# Patient Record
Sex: Female | Born: 1960 | Race: White | Hispanic: No | Marital: Single | Smoking: Current every day smoker
Health system: Southern US, Community
[De-identification: ages and names within clinical notes are randomized; demographics above are authoritative.]

## PROBLEM LIST (undated history)

## (undated) DIAGNOSIS — N289 Disorder of kidney and ureter, unspecified: Secondary | ICD-10-CM

## (undated) DIAGNOSIS — J189 Pneumonia, unspecified organism: Secondary | ICD-10-CM

## (undated) DIAGNOSIS — F329 Major depressive disorder, single episode, unspecified: Secondary | ICD-10-CM

## (undated) DIAGNOSIS — F32A Depression, unspecified: Secondary | ICD-10-CM

## (undated) DIAGNOSIS — S62109A Fracture of unspecified carpal bone, unspecified wrist, initial encounter for closed fracture: Secondary | ICD-10-CM

## (undated) DIAGNOSIS — F410 Panic disorder [episodic paroxysmal anxiety] without agoraphobia: Secondary | ICD-10-CM

## (undated) DIAGNOSIS — F319 Bipolar disorder, unspecified: Secondary | ICD-10-CM

## (undated) DIAGNOSIS — F419 Anxiety disorder, unspecified: Secondary | ICD-10-CM

## (undated) HISTORY — PX: ABDOMINAL HYSTERECTOMY: SHX81

---

## 2007-08-18 ENCOUNTER — Emergency Department: Payer: Self-pay | Admitting: Ophthalmology

## 2007-11-12 ENCOUNTER — Inpatient Hospital Stay (HOSPITAL_COMMUNITY): Admission: RE | Admit: 2007-11-12 | Discharge: 2007-11-16 | Payer: Self-pay | Admitting: *Deleted

## 2007-11-12 ENCOUNTER — Emergency Department (HOSPITAL_COMMUNITY): Admission: EM | Admit: 2007-11-12 | Discharge: 2007-11-12 | Payer: Self-pay | Admitting: Emergency Medicine

## 2007-11-12 ENCOUNTER — Ambulatory Visit: Payer: Self-pay | Admitting: *Deleted

## 2010-07-10 ENCOUNTER — Ambulatory Visit: Payer: Self-pay
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
## 2011-05-09 NOTE — Discharge Summary (Signed)
NAME:  Mary Greene, SKOP NO.:  [id number]   MEDICAL RECORD NO.:  [id number]          PATIENT TYPE:  IPS   LOCATION:  0501                          FACILITY:  BH   PHYSICIAN:  Geoffery Lyons, M.D.      DATE OF BIRTH:  28-Apr-1961   DATE OF ADMISSION:  11/12/2007  DATE OF DISCHARGE:  11/16/2007                               DISCHARGE SUMMARY   CHIEF COMPLAINT AND PRESENT ILLNESS:  This was the first admission to  [facility] for this 50 year old divorced white female,  who presented to [facility].  She endorsed she had  received an eviction note.  She felt hopeless and suicidal.  She has a  history of prior psychiatric hospitalization, but upon this evaluation  was not on any medications.  She reports poor sleep, panic attacks,  excessive worry, abusing alcohol, as she claimed to __________  symptoms  of anxiety.  The day before the admission she reported a plan to wreck  her car.  Endorsed that she was tired of struggling.  Alcohol level in  the ED was 176, endorsed that she had drank on and off most of her life  and wanted to wean herself off alcohol totally, once she received the  eviction note she drank more.   PAST PSYCHIATRIC HISTORY:  In 2004 [facility] for depression.  Has  had Zyprexa in the past, which caused weight gain.  Was seeing a  psychiatrist in [place] who __________  atypical antipsychotics and  mood stabilizers, with antidepressants.   ALCOHOL/DRUG HISTORY:  as already stated, persistent use of alcohol 6 to  12 beers 4 to 6 times a week.   MEDICAL HISTORY:  Had a stent placed in her right kidney.   MEDICATIONS:  None prescribed.   PHYSICAL EXAMINATION:  Physical exam performed and failed to show any  acute findings.   LABORATORY WORK:  White blood cells 12.9, hemoglobin 15.6.   MENTAL STATUS EXAM:  Reveals an alert cooperative female, casually  groomed and dressed.  Speech was not pressured, was  normal in rate,  tempo and production.  Mood was depressed.  Affect depressed.  Thought  process was normal.  Thought processes were logical, coherent and  relevant.  Speech was normal in rate, tempo and production.  There was  no evidence of active suicidal or homicidal ideas, and no  hallucinations.  Cognition was well-preserved.   DIAGNOSES:  AXIS I:  1.  Depressive disorder, not otherwise specified.  2.  Alcohol abuse, rule out dependence.  AXIS II:  No diagnosis.  AXIS III:  Nonfunctioning right kidney.  AXIS IV:  Moderate.  AXIS V:  On admission 30, highest GAF last year 60.   COURSE IN THE HOSPITAL:  She was admitted and started in individual and  group psychotherapy.  She was given Ambien for sleep.  She was detoxed  with Librium.  As already stated the patient is a 50 year old female  divorced, 1 daughter.  Divorced.  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  There  waslongstanding history of problem with alcohol.  Abstained for a  while.  Claimed that she caused the depression by drinking.  Felt that  the daughter just came by when she needed something from her.  Endorsed  with mood fluctuation with episodes of increased energy, racing  thoughts, decreased sleep, with episodes of depression, with no energy  and no motivation.  So, a counselor before has been on medication.   MEDICATIONS:  Has had a hard time of 40 medications.  Claims trials  with:  1. Klonopin.  2. Ambien.  3. Seroquel.  4. Wellbutrin.  5. Zoloft.  6. Zyprexa, works well, but concerned about blood sugar as well as      weight gain.   When depressed has been up to 6 months without interacting with people.  Family history of bipolar.  By November 14, 2007, she endorsed that she  was feeling better.  We had started Abilify 10 mg per day.  She  continued to be involved in  individual and group process.  Her mood was  euthymic.  Her affect was brighter.  She said that she was committed to  abstinence.   DISCHARGE DIAGNOSES:  AXIS I:  Bipolar disorder depressed.  Alcohol  dependence.  AXIS II:  No diagnosis.  AXIS III:  Nonfunctioning right kidney.  AXIS IV:  Moderate.  AXIS V:  On discharge 55 to 60.   DISCHARGE MEDICATIONS:  Discharged on:  1. Abilify 10 mg per day.  2. Trazodone 50 mg 1 to 2 tablets at bedtime for sleep.   DISCHARGE FOLLOWUP:  Follow up with __________ .      Geoffery Lyons, M.D.  Electronically Signed     IL/MEDQ  D:  12/19/2007  T:  12/20/2007  Job:  [id number]

## 2011-09-30 LAB — BASIC METABOLIC PANEL
BUN: 6
CO2: 23
Chloride: 112
Creatinine, Ser: 0.93
Glucose, Bld: 100 — ABNORMAL HIGH

## 2011-09-30 LAB — RAPID URINE DRUG SCREEN, HOSP PERFORMED
Barbiturates: NOT DETECTED
Benzodiazepines: NOT DETECTED
Cocaine: NOT DETECTED

## 2011-09-30 LAB — CBC
MCHC: 35.4
MCV: 97
Platelets: 276

## 2011-09-30 LAB — TSH: TSH: 1.235

## 2011-09-30 LAB — ETHANOL: Alcohol, Ethyl (B): 176 — ABNORMAL HIGH

## 2012-04-24 ENCOUNTER — Emergency Department: Payer: Self-pay | Admitting: Internal Medicine

## 2012-06-29 ENCOUNTER — Emergency Department: Payer: Self-pay | Admitting: Unknown Physician Specialty

## 2012-06-29 LAB — CBC
HCT: 45.2 % (ref 35.0–47.0)
MCH: 31.9 pg (ref 26.0–34.0)
MCHC: 33.1 g/dL (ref 32.0–36.0)
RDW: 13.3 % (ref 11.5–14.5)

## 2012-06-29 LAB — BASIC METABOLIC PANEL
Anion Gap: 9 (ref 7–16)
Calcium, Total: 9.3 mg/dL (ref 8.5–10.1)
Co2: 23 mmol/L (ref 21–32)
EGFR (African American): >60
EGFR (Non-African Amer.): >60
Glucose: 93 mg/dL (ref 65–99)
Osmolality: 285 (ref 275–301)

## 2012-06-29 LAB — TROPONIN I: Troponin-I: <0.02 ng/mL

## 2012-09-25 ENCOUNTER — Emergency Department: Payer: Self-pay | Admitting: Emergency Medicine

## 2012-09-25 LAB — CBC
Platelet: 259 10*3/uL (ref 150–440)
RDW: 14.3 % (ref 11.5–14.5)
WBC: 13.1 10*3/uL — ABNORMAL HIGH (ref 3.6–11.0)

## 2012-09-25 LAB — URINALYSIS, COMPLETE
Bilirubin,UR: NEGATIVE
Ketone: NEGATIVE
Leukocyte Esterase: NEGATIVE
Ph: 7 (ref 4.5–8.0)
Protein: NEGATIVE
RBC,UR: 3 /HPF (ref 0–5)

## 2013-03-03 ENCOUNTER — Emergency Department: Payer: Self-pay | Admitting: Unknown Physician Specialty

## 2013-04-20 ENCOUNTER — Emergency Department: Payer: Self-pay | Admitting: Emergency Medicine

## 2013-04-21 ENCOUNTER — Emergency Department: Payer: Self-pay | Admitting: Emergency Medicine

## 2013-04-21 LAB — URINALYSIS, COMPLETE
Bacteria: NONE SEEN
Blood: NEGATIVE
Glucose,UR: NEGATIVE mg/dL (ref 0–75)
Hyaline Cast: 1
Ketone: NEGATIVE
Nitrite: NEGATIVE
Ph: 6 (ref 4.5–8.0)
Protein: NEGATIVE
RBC,UR: 1 /HPF (ref 0–5)
Specific Gravity: 1.011 (ref 1.003–1.030)
Squamous Epithelial: 1
WBC UR: 1 /HPF (ref 0–5)

## 2013-04-21 LAB — CBC
HCT: 41.3 % (ref 35.0–47.0)
MCHC: 34.6 g/dL (ref 32.0–36.0)
MCV: 95 fL (ref 80–100)
RBC: 4.35 10*6/uL (ref 3.80–5.20)
WBC: 10.8 10*3/uL (ref 3.6–11.0)

## 2013-04-21 LAB — COMPREHENSIVE METABOLIC PANEL
Albumin: 3.5 g/dL (ref 3.4–5.0)
Alkaline Phosphatase: 78 U/L (ref 50–136)
Anion Gap: 3 — ABNORMAL LOW (ref 7–16)
BUN: 7 mg/dL (ref 7–18)
Bilirubin,Total: 0.2 mg/dL (ref 0.2–1.0)
Calcium, Total: 8.9 mg/dL (ref 8.5–10.1)
Co2: 25 mmol/L (ref 21–32)
Glucose: 89 mg/dL (ref 65–99)
Potassium: 3.8 mmol/L (ref 3.5–5.1)
SGPT (ALT): 23 U/L (ref 12–78)

## 2013-04-21 LAB — LIPASE, BLOOD: Lipase: 142 U/L (ref 73–393)

## 2013-06-26 ENCOUNTER — Emergency Department: Payer: Self-pay | Admitting: Emergency Medicine

## 2013-06-26 LAB — COMPREHENSIVE METABOLIC PANEL
Albumin: 3.4 g/dL (ref 3.4–5.0)
Alkaline Phosphatase: 94 U/L (ref 50–136)
Bilirubin,Total: 0.5 mg/dL (ref 0.2–1.0)
Calcium, Total: 9 mg/dL (ref 8.5–10.1)
Creatinine: 0.94 mg/dL (ref 0.60–1.30)
EGFR (Non-African Amer.): >60
Potassium: 3.7 mmol/L (ref 3.5–5.1)
SGPT (ALT): 22 U/L (ref 12–78)
Sodium: 141 mmol/L (ref 136–145)

## 2013-06-26 LAB — LIPASE, BLOOD: Lipase: 124 U/L (ref 73–393)

## 2013-06-26 LAB — CBC
MCH: 32.5 pg (ref 26.0–34.0)
MCHC: 34.2 g/dL (ref 32.0–36.0)
Platelet: 208 10*3/uL (ref 150–440)
RBC: 4.55 10*6/uL (ref 3.80–5.20)
WBC: 9 10*3/uL (ref 3.6–11.0)

## 2014-03-13 ENCOUNTER — Ambulatory Visit: Payer: Self-pay

## 2014-06-10 ENCOUNTER — Emergency Department: Payer: Self-pay | Admitting: Emergency Medicine

## 2014-06-10 LAB — URINALYSIS, COMPLETE
Bilirubin,UR: NEGATIVE
Glucose,UR: NEGATIVE mg/dL (ref 0–75)
KETONE: NEGATIVE
Leukocyte Esterase: NEGATIVE
Nitrite: NEGATIVE
PH: 5 (ref 4.5–8.0)
Protein: NEGATIVE
RBC,UR: 1 /HPF (ref 0–5)
Specific Gravity: 1.012 (ref 1.003–1.030)
Squamous Epithelial: 1
WBC UR: 1 /HPF (ref 0–5)

## 2014-06-10 LAB — BASIC METABOLIC PANEL
Anion Gap: 6 — ABNORMAL LOW (ref 7–16)
BUN: 14 mg/dL (ref 7–18)
CREATININE: 0.94 mg/dL (ref 0.60–1.30)
Calcium, Total: 8.8 mg/dL (ref 8.5–10.1)
Chloride: 109 mmol/L — ABNORMAL HIGH (ref 98–107)
Co2: 25 mmol/L (ref 21–32)
EGFR (African American): >60
Glucose: 97 mg/dL (ref 65–99)
Osmolality: 280 (ref 275–301)
Potassium: 4.1 mmol/L (ref 3.5–5.1)
SODIUM: 140 mmol/L (ref 136–145)

## 2014-06-10 LAB — CBC
HCT: 42.3 % (ref 35.0–47.0)
HGB: 14.3 g/dL (ref 12.0–16.0)
MCH: 33.2 pg (ref 26.0–34.0)
MCHC: 33.8 g/dL (ref 32.0–36.0)
MCV: 98 fL (ref 80–100)
Platelet: 223 10*3/uL (ref 150–440)
RBC: 4.3 10*6/uL (ref 3.80–5.20)
RDW: 13.7 % (ref 11.5–14.5)
WBC: 10 10*3/uL (ref 3.6–11.0)

## 2014-06-11 LAB — URINE CULTURE

## 2014-06-21 DIAGNOSIS — S62109A Fracture of unspecified carpal bone, unspecified wrist, initial encounter for closed fracture: Secondary | ICD-10-CM

## 2014-06-21 HISTORY — DX: Fracture of unspecified carpal bone, unspecified wrist, initial encounter for closed fracture: S62.109A

## 2014-07-11 ENCOUNTER — Emergency Department: Payer: Self-pay | Admitting: Emergency Medicine

## 2014-07-24 ENCOUNTER — Ambulatory Visit: Payer: Self-pay | Admitting: Specialist

## 2014-08-14 ENCOUNTER — Emergency Department: Payer: Self-pay | Admitting: Emergency Medicine

## 2014-08-14 LAB — URINALYSIS, COMPLETE
Bilirubin,UR: NEGATIVE
GLUCOSE, UR: NEGATIVE mg/dL (ref 0–75)
Ketone: NEGATIVE
Leukocyte Esterase: NEGATIVE
NITRITE: NEGATIVE
PH: 6 (ref 4.5–8.0)
Protein: NEGATIVE
RBC,UR: <1 /HPF (ref 0–5)
Specific Gravity: 1.004 (ref 1.003–1.030)
Squamous Epithelial: <1
WBC UR: <1 /HPF (ref 0–5)

## 2014-08-14 LAB — COMPREHENSIVE METABOLIC PANEL
ALK PHOS: 72 U/L
Albumin: 4.1 g/dL (ref 3.4–5.0)
Anion Gap: 9 (ref 7–16)
BUN: 3 mg/dL — ABNORMAL LOW (ref 7–18)
Bilirubin,Total: 0.4 mg/dL (ref 0.2–1.0)
CALCIUM: 9.3 mg/dL (ref 8.5–10.1)
CREATININE: 1.13 mg/dL (ref 0.60–1.30)
Chloride: 107 mmol/L (ref 98–107)
Co2: 23 mmol/L (ref 21–32)
EGFR (African American): >60
EGFR (Non-African Amer.): 55 — ABNORMAL LOW
GLUCOSE: 91 mg/dL (ref 65–99)
Osmolality: 274 (ref 275–301)
Potassium: 3.1 mmol/L — ABNORMAL LOW (ref 3.5–5.1)
SGOT(AST): 21 U/L (ref 15–37)
SGPT (ALT): 27 U/L
Sodium: 139 mmol/L (ref 136–145)
Total Protein: 7.8 g/dL (ref 6.4–8.2)

## 2014-08-14 LAB — CBC WITH DIFFERENTIAL/PLATELET
Basophil #: 0.2 10*3/uL — ABNORMAL HIGH (ref 0.0–0.1)
Basophil %: 1.3 %
Eosinophil #: 0.2 10*3/uL (ref 0.0–0.7)
Eosinophil %: 1.3 %
HCT: 44.3 % (ref 35.0–47.0)
HGB: 14.6 g/dL (ref 12.0–16.0)
LYMPHS ABS: 4.3 10*3/uL — AB (ref 1.0–3.6)
LYMPHS PCT: 29 %
MCH: 32.8 pg (ref 26.0–34.0)
MCHC: 33 g/dL (ref 32.0–36.0)
MCV: 99 fL (ref 80–100)
MONO ABS: 1.5 x10 3/mm — AB (ref 0.2–0.9)
Monocyte %: 9.9 %
Neutrophil #: 8.7 10*3/uL — ABNORMAL HIGH (ref 1.4–6.5)
Neutrophil %: 58.5 %
Platelet: 291 10*3/uL (ref 150–440)
RBC: 4.46 10*6/uL (ref 3.80–5.20)
RDW: 13.3 % (ref 11.5–14.5)
WBC: 14.9 10*3/uL — AB (ref 3.6–11.0)

## 2014-08-14 LAB — LIPASE, BLOOD: LIPASE: 132 U/L (ref 73–393)

## 2014-08-14 LAB — CLOSTRIDIUM DIFFICILE(ARMC)

## 2014-08-15 LAB — WBCS, STOOL

## 2014-08-17 LAB — STOOL CULTURE

## 2014-10-08 ENCOUNTER — Inpatient Hospital Stay: Payer: Self-pay | Admitting: Internal Medicine

## 2014-10-08 LAB — CBC
HCT: 44.4 % (ref 35.0–47.0)
HGB: 14.6 g/dL (ref 12.0–16.0)
MCH: 31.7 pg (ref 26.0–34.0)
MCHC: 33 g/dL (ref 32.0–36.0)
MCV: 96 fL (ref 80–100)
Platelet: 266 10*3/uL (ref 150–440)
RBC: 4.63 10*6/uL (ref 3.80–5.20)
RDW: 12.4 % (ref 11.5–14.5)
WBC: 21.1 10*3/uL — ABNORMAL HIGH (ref 3.6–11.0)

## 2014-10-08 LAB — COMPREHENSIVE METABOLIC PANEL
Albumin: 3.7 g/dL (ref 3.4–5.0)
Alkaline Phosphatase: 122 U/L — ABNORMAL HIGH
Anion Gap: 14 (ref 7–16)
BILIRUBIN TOTAL: 0.7 mg/dL (ref 0.2–1.0)
BUN: 6 mg/dL — ABNORMAL LOW (ref 7–18)
CREATININE: 1.12 mg/dL (ref 0.60–1.30)
Calcium, Total: 9 mg/dL (ref 8.5–10.1)
Chloride: 98 mmol/L (ref 98–107)
Co2: 21 mmol/L (ref 21–32)
EGFR (African American): >60
EGFR (Non-African Amer.): 54 — ABNORMAL LOW
GLUCOSE: 101 mg/dL — AB (ref 65–99)
Osmolality: 264 (ref 275–301)
Potassium: 3.2 mmol/L — ABNORMAL LOW (ref 3.5–5.1)
SGOT(AST): 29 U/L (ref 15–37)
SGPT (ALT): 24 U/L
SODIUM: 133 mmol/L — AB (ref 136–145)
TOTAL PROTEIN: 7.7 g/dL (ref 6.4–8.2)

## 2014-10-08 LAB — URINALYSIS, COMPLETE
BILIRUBIN, UR: NEGATIVE
Glucose,UR: NEGATIVE mg/dL (ref 0–75)
KETONE: NEGATIVE
LEUKOCYTE ESTERASE: NEGATIVE
Nitrite: NEGATIVE
Ph: 6 (ref 4.5–8.0)
Protein: NEGATIVE
Specific Gravity: 1.011 (ref 1.003–1.030)
WBC UR: 4 /HPF (ref 0–5)

## 2014-10-08 LAB — TROPONIN I: Troponin-I: <0.02 ng/mL

## 2014-10-08 LAB — CK TOTAL AND CKMB (NOT AT ARMC)
CK, Total: 194 U/L — ABNORMAL HIGH
CK-MB: 4.9 ng/mL — ABNORMAL HIGH (ref 0.5–3.6)

## 2014-10-09 LAB — CBC WITH DIFFERENTIAL/PLATELET
BASOS PCT: 1.3 %
Basophil #: 0.2 10*3/uL — ABNORMAL HIGH (ref 0.0–0.1)
EOS PCT: 2.4 %
Eosinophil #: 0.3 10*3/uL (ref 0.0–0.7)
HCT: 37.6 % (ref 35.0–47.0)
HGB: 12 g/dL (ref 12.0–16.0)
LYMPHS PCT: 23.7 %
Lymphocyte #: 3.3 10*3/uL (ref 1.0–3.6)
MCH: 31.5 pg (ref 26.0–34.0)
MCHC: 32 g/dL (ref 32.0–36.0)
MCV: 99 fL (ref 80–100)
Monocyte #: 1.3 x10 3/mm — ABNORMAL HIGH (ref 0.2–0.9)
Monocyte %: 9.1 %
Neutrophil #: 8.9 10*3/uL — ABNORMAL HIGH (ref 1.4–6.5)
Neutrophil %: 63.5 %
Platelet: 232 10*3/uL (ref 150–440)
RBC: 3.81 10*6/uL (ref 3.80–5.20)
RDW: 12.8 % (ref 11.5–14.5)
WBC: 14.1 10*3/uL — ABNORMAL HIGH (ref 3.6–11.0)

## 2014-10-09 LAB — BASIC METABOLIC PANEL
Anion Gap: 6 — ABNORMAL LOW (ref 7–16)
BUN: 11 mg/dL (ref 7–18)
CHLORIDE: 109 mmol/L — AB (ref 98–107)
CO2: 25 mmol/L (ref 21–32)
CREATININE: 0.98 mg/dL (ref 0.60–1.30)
Calcium, Total: 8 mg/dL — ABNORMAL LOW (ref 8.5–10.1)
EGFR (African American): >60
EGFR (Non-African Amer.): >60
Glucose: 85 mg/dL (ref 65–99)
Osmolality: 278 (ref 275–301)
Potassium: 4 mmol/L (ref 3.5–5.1)
Sodium: 140 mmol/L (ref 136–145)

## 2014-10-09 LAB — URINE CULTURE

## 2014-10-13 LAB — CULTURE, BLOOD (SINGLE)

## 2014-10-24 ENCOUNTER — Emergency Department: Payer: Self-pay | Admitting: Emergency Medicine

## 2014-10-28 ENCOUNTER — Emergency Department: Payer: Self-pay | Admitting: Emergency Medicine

## 2014-10-28 LAB — URINALYSIS, COMPLETE
Bilirubin,UR: NEGATIVE
Glucose,UR: NEGATIVE mg/dL (ref 0–75)
Ketone: NEGATIVE
LEUKOCYTE ESTERASE: NEGATIVE
NITRITE: NEGATIVE
PH: 5 (ref 4.5–8.0)
Protein: NEGATIVE
RBC,UR: 1 /HPF (ref 0–5)
SPECIFIC GRAVITY: 1.006 (ref 1.003–1.030)
Squamous Epithelial: 7
WBC UR: 2 /HPF (ref 0–5)

## 2014-10-28 LAB — CBC
HCT: 42.2 % (ref 35.0–47.0)
HGB: 13.9 g/dL (ref 12.0–16.0)
MCH: 32.9 pg (ref 26.0–34.0)
MCHC: 33 g/dL (ref 32.0–36.0)
MCV: 100 fL (ref 80–100)
Platelet: 269 10*3/uL (ref 150–440)
RBC: 4.22 10*6/uL (ref 3.80–5.20)
RDW: 13.2 % (ref 11.5–14.5)
WBC: 10.2 10*3/uL (ref 3.6–11.0)

## 2014-10-28 LAB — COMPREHENSIVE METABOLIC PANEL
ALT: 28 U/L
ANION GAP: 6 — AB (ref 7–16)
AST: 22 U/L (ref 15–37)
Albumin: 3.5 g/dL (ref 3.4–5.0)
Alkaline Phosphatase: 87 U/L
BILIRUBIN TOTAL: 0.4 mg/dL (ref 0.2–1.0)
BUN: 5 mg/dL — ABNORMAL LOW (ref 7–18)
CO2: 26 mmol/L (ref 21–32)
Calcium, Total: 9 mg/dL (ref 8.5–10.1)
Chloride: 107 mmol/L (ref 98–107)
Creatinine: 0.98 mg/dL (ref 0.60–1.30)
EGFR (Non-African Amer.): >60
GLUCOSE: 100 mg/dL — AB (ref 65–99)
OSMOLALITY: 275 (ref 275–301)
Potassium: 3.8 mmol/L (ref 3.5–5.1)
SODIUM: 139 mmol/L (ref 136–145)
Total Protein: 7.1 g/dL (ref 6.4–8.2)

## 2014-10-28 LAB — DRUG SCREEN, URINE
AMPHETAMINES, UR SCREEN: NEGATIVE (ref ?–1000)
BENZODIAZEPINE, UR SCRN: POSITIVE (ref ?–200)
Barbiturates, Ur Screen: NEGATIVE (ref ?–200)
COCAINE METABOLITE, UR ~~LOC~~: NEGATIVE (ref ?–300)
Cannabinoid 50 Ng, Ur ~~LOC~~: NEGATIVE (ref ?–50)
MDMA (ECSTASY) UR SCREEN: NEGATIVE (ref ?–500)
Methadone, Ur Screen: NEGATIVE (ref ?–300)
Opiate, Ur Screen: NEGATIVE (ref ?–300)
PHENCYCLIDINE (PCP) UR S: NEGATIVE (ref ?–25)
TRICYCLIC, UR SCREEN: NEGATIVE (ref ?–1000)

## 2014-10-28 LAB — ETHANOL: Ethanol: <3 mg/dL

## 2014-10-28 LAB — SALICYLATE LEVEL: SALICYLATES, SERUM: 3.4 mg/dL — AB

## 2014-10-28 LAB — TSH: THYROID STIMULATING HORM: 1.15 u[IU]/mL

## 2014-10-28 LAB — ACETAMINOPHEN LEVEL

## 2014-11-01 ENCOUNTER — Emergency Department (HOSPITAL_COMMUNITY)
Admission: EM | Admit: 2014-11-01 | Discharge: 2014-11-01 | Disposition: A | Discharge status: Other Acute Inpatient Hospital | Payer: BC Managed Care – PPO | Attending: Psychiatry | Admitting: Psychiatry

## 2014-11-01 ENCOUNTER — Inpatient Hospital Stay (HOSPITAL_COMMUNITY)
Admission: AD | Admit: 2014-11-01 | Discharge: 2014-11-08 | DRG: 885 | Disposition: A | Discharge status: Home / Self Care | Payer: BC Managed Care – PPO | Source: Intra-hospital | Attending: Psychiatry | Admitting: Psychiatry

## 2014-11-01 ENCOUNTER — Encounter (HOSPITAL_COMMUNITY): Payer: Self-pay | Admitting: *Deleted

## 2014-11-01 DIAGNOSIS — Z79899 Other long term (current) drug therapy: Secondary | ICD-10-CM | POA: Diagnosis not present

## 2014-11-01 DIAGNOSIS — F1320 Sedative, hypnotic or anxiolytic dependence, uncomplicated: Secondary | ICD-10-CM | POA: Diagnosis present

## 2014-11-01 DIAGNOSIS — F329 Major depressive disorder, single episode, unspecified: Secondary | ICD-10-CM | POA: Insufficient documentation

## 2014-11-01 DIAGNOSIS — R45851 Suicidal ideations: Secondary | ICD-10-CM | POA: Diagnosis present

## 2014-11-01 DIAGNOSIS — Z8701 Personal history of pneumonia (recurrent): Secondary | ICD-10-CM | POA: Diagnosis not present

## 2014-11-01 DIAGNOSIS — F3130 Bipolar disorder, current episode depressed, mild or moderate severity, unspecified: Principal | ICD-10-CM | POA: Diagnosis present

## 2014-11-01 DIAGNOSIS — Z720 Tobacco use: Secondary | ICD-10-CM | POA: Insufficient documentation

## 2014-11-01 DIAGNOSIS — F1310 Sedative, hypnotic or anxiolytic abuse, uncomplicated: Secondary | ICD-10-CM | POA: Diagnosis not present

## 2014-11-01 DIAGNOSIS — F419 Anxiety disorder, unspecified: Secondary | ICD-10-CM | POA: Insufficient documentation

## 2014-11-01 DIAGNOSIS — F32A Depression, unspecified: Secondary | ICD-10-CM

## 2014-11-01 HISTORY — DX: Disorder of kidney and ureter, unspecified: N28.9

## 2014-11-01 HISTORY — DX: Anxiety disorder, unspecified: F41.9

## 2014-11-01 HISTORY — DX: Pneumonia, unspecified organism: J18.9

## 2014-11-01 HISTORY — DX: Major depressive disorder, single episode, unspecified: F32.9

## 2014-11-01 HISTORY — DX: Bipolar disorder, unspecified: F31.9

## 2014-11-01 HISTORY — DX: Panic disorder (episodic paroxysmal anxiety): F41.0

## 2014-11-01 HISTORY — DX: Fracture of unspecified carpal bone, unspecified wrist, initial encounter for closed fracture: S62.109A

## 2014-11-01 HISTORY — DX: Depression, unspecified: F32.A

## 2014-11-01 LAB — RAPID URINE DRUG SCREEN, HOSP PERFORMED
AMPHETAMINES: NOT DETECTED
BARBITURATES: NOT DETECTED
BENZODIAZEPINES: POSITIVE — AB
COCAINE: NOT DETECTED
OPIATES: NOT DETECTED
TETRAHYDROCANNABINOL: NOT DETECTED

## 2014-11-01 LAB — CBC WITH DIFFERENTIAL/PLATELET
BASOS ABS: 0.1 10*3/uL (ref 0.0–0.1)
BASOS PCT: 0 % (ref 0–1)
EOS ABS: 0.2 10*3/uL (ref 0.0–0.7)
Eosinophils Relative: 2 % (ref 0–5)
HCT: 44.8 % (ref 36.0–46.0)
HEMOGLOBIN: 15.3 g/dL — AB (ref 12.0–15.0)
Lymphocytes Relative: 28 % (ref 12–46)
Lymphs Abs: 3.2 10*3/uL (ref 0.7–4.0)
MCH: 32.9 pg (ref 26.0–34.0)
MCHC: 34.2 g/dL (ref 30.0–36.0)
MCV: 96.3 fL (ref 78.0–100.0)
MONO ABS: 0.7 10*3/uL (ref 0.1–1.0)
MONOS PCT: 6 % (ref 3–12)
NEUTROS PCT: 64 % (ref 43–77)
Neutro Abs: 7.2 10*3/uL (ref 1.7–7.7)
Platelets: 338 10*3/uL (ref 150–400)
RBC: 4.65 MIL/uL (ref 3.87–5.11)
RDW: 12.9 % (ref 11.5–15.5)
WBC: 11.3 10*3/uL — ABNORMAL HIGH (ref 4.0–10.5)

## 2014-11-01 LAB — BASIC METABOLIC PANEL
Anion gap: 15 (ref 5–15)
BUN: 7 mg/dL (ref 6–23)
CALCIUM: 9.4 mg/dL (ref 8.4–10.5)
CHLORIDE: 104 meq/L (ref 96–112)
CO2: 22 mEq/L (ref 19–32)
CREATININE: 0.88 mg/dL (ref 0.50–1.10)
GFR calc non Af Amer: 73 mL/min — ABNORMAL LOW (ref >90)
GFR, EST AFRICAN AMERICAN: 85 mL/min — AB (ref >90)
Glucose, Bld: 101 mg/dL — ABNORMAL HIGH (ref 70–99)
Potassium: 4.1 mEq/L (ref 3.7–5.3)
Sodium: 141 mEq/L (ref 137–147)

## 2014-11-01 LAB — ETHANOL: Alcohol, Ethyl (B): <11 mg/dL (ref 0–11)

## 2014-11-01 MED ORDER — CHLORDIAZEPOXIDE HCL 25 MG PO CAPS: 25.0000 mg | ORAL_CAPSULE | Freq: Four times a day (QID) | ORAL | Status: DC | PRN

## 2014-11-01 MED ORDER — MAGNESIUM HYDROXIDE 400 MG/5ML PO SUSP: 30.0000 mL | Freq: Every day | ORAL | Status: DC | PRN

## 2014-11-01 MED ORDER — ALUM & MAG HYDROXIDE-SIMETH 200-200-20 MG/5ML PO SUSP: 30.0000 mL | ORAL | Status: DC | PRN

## 2014-11-01 MED ORDER — HYDROXYZINE HCL 25 MG PO TABS
25.0000 mg | ORAL_TABLET | Freq: Four times a day (QID) | ORAL | Status: DC | PRN
  Administered 2014-11-02 – 2014-11-08 (×18): 25 mg via ORAL
  Filled 2014-11-01 (×3): qty 1
  Filled 2014-11-01: qty 20
  Filled 2014-11-01 (×15): qty 1

## 2014-11-01 MED ORDER — CHLORDIAZEPOXIDE HCL 25 MG PO CAPS
25.0000 mg | ORAL_CAPSULE | Freq: Four times a day (QID) | ORAL | Status: DC | PRN
  Administered 2014-11-01 – 2014-11-02 (×3): 25 mg via ORAL
  Filled 2014-11-01 (×3): qty 1

## 2014-11-01 MED ORDER — TRAZODONE HCL 50 MG PO TABS
50.0000 mg | ORAL_TABLET | Freq: Every evening | ORAL | Status: DC | PRN
  Administered 2014-11-01 – 2014-11-03 (×3): 50 mg via ORAL
  Filled 2014-11-01 (×3): qty 1

## 2014-11-01 MED ORDER — ACETAMINOPHEN 325 MG PO TABS
650.0000 mg | ORAL_TABLET | Freq: Four times a day (QID) | ORAL | Status: DC | PRN
  Administered 2014-11-02 – 2014-11-03 (×2): 650 mg via ORAL
  Filled 2014-11-01 (×2): qty 2

## 2014-11-01 NOTE — BH Assessment (Addendum)
Spoke with ED nurse Sharlet SalinaBenjamin whom is in agreement to set up Tele-Psych cart for tele assessment to begin.  Glorious PeachNajah Melissaann Dizdarevic, MS, LCASA Assessment Counselor

## 2014-11-01 NOTE — BH Assessment (Signed)
Pt has been assigned a bed by [facility] Berneice Heinrichina Tate. Pt has been assigned to 301-1. Attending assigned is Dr. Jama Flavorsobos. EDP Dr.McManus has been notified of pt's acceptance. ED nurse Drinda ButtsAnnette notified and is in agreement with having patient sign voluntary consent forms. Nurse will coordinate transport via [place].  Glorious PeachNajah Brayden Brodhead, MS, LCASA Assessment Counselor

## 2014-11-01 NOTE — ED Provider Notes (Signed)
CSN: [id number]     Arrival date & time 11/01/14  0919 History   First MD Initiated Contact with Patient 11/01/14 0932     Chief Complaint  Patient presents with  . Suicidal  . Medical Clearance      HPI  Pt was seen at 0935. Per pt, c/o gradual onset and worsening of persistent anxiety, panic attacks, depression and SI for the past 1 week. Pt states she "can't find" her ativan prescription before her symptoms began. Pt endorses vague SI without a plan. Pt has not been evaluated by her regular mental health provider for these symptoms but states she called the office and "they said they won't refill my ativan." Pt states she was [facility] yesterday for same and "didn't talk to a psychiatrist." Pt states she came to this ED "because I have to talk with someone." Denies SA, no HI, no hallucinations.    Past Medical History  Diagnosis Date  . Anxiety   . Depression   . Bipolar 1 disorder   . Pneumonia   . Non-functioning kidney     right  . Panic attacks    History reviewed. No pertinent past surgical history.  History  Substance Use Topics  . Smoking status: Current Every Day Smoker    Types: Cigarettes  . Smokeless tobacco: Not on file  . Alcohol Use: No     Comment: former etoh abuse    Review of Systems ROS: Statement: All systems negative except as marked or noted in the HPI; Constitutional: Negative for fever and chills. ; ; Eyes: Negative for eye pain, redness and discharge. ; ; ENMT: Negative for ear pain, hoarseness, nasal congestion, sinus pressure and sore throat. ; ; Cardiovascular: Negative for chest pain, palpitations, diaphoresis, dyspnea and peripheral edema. ; ; Respiratory: Negative for cough, wheezing and stridor. ; ; Gastrointestinal: Negative for nausea, vomiting, diarrhea, abdominal pain, blood in stool, hematemesis, jaundice and rectal bleeding. . ; ; Genitourinary: Negative for dysuria, flank pain and hematuria. ; ; Musculoskeletal: Negative  for back pain and neck pain. Negative for swelling and trauma.; ; Skin: Negative for pruritus, rash, abrasions, blisters, bruising and skin lesion.; ; Neuro: Negative for headache, lightheadedness and neck stiffness. Negative for weakness, altered level of consciousness , altered mental status, extremity weakness, paresthesias, involuntary movement, seizure and syncope.; Psych:  +vague SI without plan, anxiety, depression. No SA, no HI, no hallucinations.    Allergies  Review of patient's allergies indicates no known allergies.  Home Medications   Prior to Admission medications   Not on File   BP 125/63 mmHg  Pulse 87  Temp(Src) 97.3 F (36.3 C) (Oral)  Resp 18  SpO2 100% Physical Exam  0940: Physical examination:  Nursing notes reviewed; Vital signs and O2 SAT reviewed;  Constitutional: Well developed, Well nourished, Well hydrated, In no acute distress; Head:  Normocephalic, atraumatic; Eyes: EOMI, PERRL, No scleral icterus; ENMT: Mouth and pharynx normal, Mucous membranes moist; Neck: Supple, Full range of motion; Cardiovascular: Regular rate and rhythm, No gallop; Respiratory: Breath sounds clear & equal bilaterally, No wheezes.  Speaking full sentences with ease, Normal respiratory effort/excursion; Chest: No deformity, Movement normal; Abdomen: Soft, Nontender, Nondistended, Normal bowel sounds;; Extremities: Pulses normal, No tenderness, No edema, No calf edema or asymmetry.; Neuro: AA&Ox3, Major CN grossly intact.  Speech clear. No gross focal motor or sensory deficits in extremities. Climbs on and off stretcher easily by herself. Gait steady.; Skin: Color normal, Warm, Dry.; Psych:  Anxious.    ED Course  Procedures     MDM  MDM Reviewed: previous chart, nursing note and vitals Reviewed previous: labs Interpretation: labs     Results for orders placed or performed during the hospital encounter of 11/01/14  Urine rapid drug screen (hosp performed)  Result Value Ref  Range   Opiates NONE DETECTED NONE DETECTED   Cocaine NONE DETECTED NONE DETECTED   Benzodiazepines POSITIVE (A) NONE DETECTED   Amphetamines NONE DETECTED NONE DETECTED   Tetrahydrocannabinol NONE DETECTED NONE DETECTED   Barbiturates NONE DETECTED NONE DETECTED  Basic metabolic panel  Result Value Ref Range   Sodium 141 137 - 147 mEq/L   Potassium 4.1 3.7 - 5.3 mEq/L   Chloride 104 96 - 112 mEq/L   CO2 22 19 - 32 mEq/L   Glucose, Bld 101 (H) 70 - 99 mg/dL   BUN 7 6 - 23 mg/dL   Creatinine, Ser 2.130.88 0.50 - 1.10 mg/dL   Calcium 9.4 8.4 - 08.610.5 mg/dL   GFR calc non Af Amer 73 (L) >90 mL/min   GFR calc Af Amer 85 (L) >90 mL/min   Anion gap 15 5 - 15  CBC with Differential  Result Value Ref Range   WBC 11.3 (H) 4.0 - 10.5 K/uL   RBC 4.65 3.87 - 5.11 MIL/uL   Hemoglobin 15.3 (H) 12.0 - 15.0 g/dL   HCT 57.844.8 46.936.0 - 62.946.0 %   MCV 96.3 78.0 - 100.0 fL   MCH 32.9 26.0 - 34.0 pg   MCHC 34.2 30.0 - 36.0 g/dL   RDW 52.812.9 41.311.5 - 24.415.5 %   Platelets 338 150 - 400 K/uL   Neutrophils Relative % 64 43 - 77 %   Neutro Abs 7.2 1.7 - 7.7 K/uL   Lymphocytes Relative 28 12 - 46 %   Lymphs Abs 3.2 0.7 - 4.0 K/uL   Monocytes Relative 6 3 - 12 %   Monocytes Absolute 0.7 0.1 - 1.0 K/uL   Eosinophils Relative 2 0 - 5 %   Eosinophils Absolute 0.2 0.0 - 0.7 K/uL   Basophils Relative 0 0 - 1 %   Basophils Absolute 0.1 0.0 - 0.1 K/uL  Ethanol  Result Value Ref Range   Alcohol, Ethyl (B) <11 0 - 11 mg/dL     [id number]:  TTS evaluation pending.   1255:  TTS has evaluated pt: states [facility]. Will transfer stable.   Samuel JesterKathleen Aryav Wimberly, DO 11/02/14 [phone]

## 2014-11-01 NOTE — Tx Team (Signed)
Initial Interdisciplinary Treatment Plan   PATIENT STRESSORS: Financial difficulties Health problems Medication change or noncompliance Occupational concerns   PATIENT STRENGTHS: Ability for insight Average or above average intelligence Capable of independent living MetallurgistCommunication skills Financial means General fund of knowledge Motivation for treatment/growth Physical Health   PROBLEM LIST: Problem List/Patient Goals Date to be addressed Date deferred Reason deferred Estimated date of resolution  "suicidal ideation" 11/01/2014   D/c        "anxiety" 11/01/2014   D/c        "depression" 11/01/2014   D/c                           DISCHARGE CRITERIA:  Ability to meet basic life and health needs Adequate post-discharge living arrangements Improved stabilization in mood, thinking, and/or behavior Medical problems require only outpatient monitoring Motivation to continue treatment in a less acute level of care Need for constant or close observation no longer present Reduction of life-threatening or endangering symptoms to within safe limits Safe-care adequate arrangements made Verbal commitment to aftercare and medication compliance  PRELIMINARY DISCHARGE PLAN: Attend aftercare/continuing care group Attend PHP/IOP Outpatient therapy Placement in alternative living arrangements Return to previous work or school arrangements  PATIENT/FAMIILY INVOLVEMENT: This treatment plan has been presented to and reviewed with the patient, Mary Greene, who is determined to get treatment for suicidal thoughts, anxiety and depression..  The patient and family have been given the opportunity to ask questions and make suggestions.  Earline MayotteKnight, Johann Gascoigne Shephard 11/01/2014, 4:16 PM
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
## 2014-11-01 NOTE — ED Notes (Signed)
Pt has ambulated to pod c with her sitter; she is pleasant, calm and cooperative

## 2014-11-01 NOTE — Progress Notes (Signed)
Adult Psychoeducational Group Note  Date:  11/01/2014 Time:  11:20 PM  Group Topic/Focus:  Narcotics Anonymous  Participation Level:  Active  Participation Quality:  Appropriate  Affect:  Appropriate  Cognitive:  Alert and Appropriate  Insight: Appropriate  Engagement in Group:  Engaged  Modes of Intervention:  Education  Additional Comments:  Pt attended and participated in group.  Berlin Hunuttle, Rionna Feltes M 11/01/2014, 11:20 PM

## 2014-11-01 NOTE — ED Notes (Signed)
Pt changed into scrubs, wanded by security, sitter at bedside

## 2014-11-01 NOTE — ED Notes (Signed)
Pt reports being off her anxiety and depression meds x 1 week and now having thoughts of suicide, no attempts made. Calm and cooperative at triage, called for a sitter.

## 2014-11-01 NOTE — BH Assessment (Signed)
Spoke with EDP Dr.McManus to obtain clinicals prior to assessing patient.  Glorious PeachNajah Delani Kohli, MS, LCASA Assessment Counselor
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
## 2014-11-01 NOTE — ED Notes (Signed)
[facility] (Driver: Count) here to transport pt. Count made aware to give secure patient valuables envelope to [place], Charity fundraiserN.

## 2014-11-02 DIAGNOSIS — F1320 Sedative, hypnotic or anxiolytic dependence, uncomplicated: Secondary | ICD-10-CM

## 2014-11-02 DIAGNOSIS — F329 Major depressive disorder, single episode, unspecified: Secondary | ICD-10-CM

## 2014-11-02 MED ORDER — CHLORDIAZEPOXIDE HCL 25 MG PO CAPS: 25.0000 mg | ORAL_CAPSULE | Freq: Four times a day (QID) | ORAL | Status: DC | PRN

## 2014-11-02 MED ORDER — LAMOTRIGINE 25 MG PO TABS
25.0000 mg | ORAL_TABLET | Freq: Every day | ORAL | Status: DC
  Administered 2014-11-03 – 2014-11-08 (×6): 25 mg via ORAL
  Filled 2014-11-02 (×5): qty 1
  Filled 2014-11-02: qty 14
  Filled 2014-11-02 (×3): qty 1

## 2014-11-02 NOTE — Progress Notes (Addendum)
Patient stated vistaril helps her anxiety better than Librium.  Stated she is not going back to [place], will probably stay in [place].  Continues to hear her mother's mumblings and making negative remarks to her.  [facility]  Continues to have "feelings of doom".  Patient was in prison approximately 10 years ago for using her mother's credit cards.  Denied SI and HI this afternoon, contracts for safety.
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
## 2014-11-02 NOTE — [facility] Suicide Risk Assessment (Signed)
[facility] INPATIENT:  Family/Significant Other Suicide Prevention Education  Suicide Prevention Education:  Patient Refusal for Family/Significant Other Suicide Prevention Education: The patient Mary Greene has refused to provide written consent for family/significant other to be provided Family/Significant Other Suicide Prevention Education during admission and/or prior to discharge.  Physician notified. SPE reviewed with patient, brochure provided.  Theophile Harvie, [place]ristin L 11/02/2014, 1:00 PM
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
## 2014-11-02 NOTE — Progress Notes (Signed)
Pt has been in her room most of the shift as she is a new pt to the hall today.  She is hopeful to get on meds that will help her.  She wants to stay off of the Ativan.  Pt contracts for safety.  She denies HI/AV.  Pt was encouraged to make her needs known to staff.  Pt was encouraged to participate in unit activities.  Support and encouragement offered.  Safety maintained with q15 minute checks.

## 2014-11-02 NOTE — B[facility] Group Notes (Signed)
[facility] LCSW Group Therapy 11/02/2014  1:15 PM   Type of Therapy: Group Therapy  Participation Level: Did Not Attend- patient in bed.   Samuella BruinKristin Khamila Bassinger, MSW, [employer]CSWA Clinical Social Worker [facility] [phone]
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
## 2014-11-03 LAB — TSH: TSH: 2.09 u[IU]/mL (ref 0.350–4.500)

## 2014-11-03 NOTE — Progress Notes (Signed)
NUTRITION ASSESSMENT  Pt identified as at risk on the Malnutrition Screen Tool  INTERVENTION: 1. Educated patient on the importance of nutrition and encouraged intake of food and beverages. 2.  Discussed healthy weight loss.  NUTRITION DIAGNOSIS: Unintentional weight loss related to sub-optimal intake as evidenced by pt report.   Goal: Pt to meet >/= 90% of their estimated nutrition needs.  Monitor:  PO intake  Assessment:  Patient admitted with depression and SI.  Patient reports eating healthy with resulting 80 lb weight loss prior to wrist fracture.  Currently reports diarrhea 3-4 times per day for the past 5-6 days secondary to detox.  Patient reports that diarrhea is improving.  53 y.o. female  Height: Ht Readings from Last 1 Encounters:  11/01/14 4' 11.75" (1.518 m)    Weight: Wt Readings from Last 1 Encounters:  11/01/14 220 lb (99.791 kg)    Weight Hx: Wt Readings from Last 10 Encounters:  11/01/14 220 lb (99.791 kg)    BMI:  Body mass index is 43.31 kg/(m^2). Pt meets criteria for extreme obesity based on current BMI.  Estimated Nutritional Needs: Kcal: 25-30 kcal/kg Protein: > 1 gram protein/kg Fluid: 1 ml/kcal  Diet Order: Diet regular Pt is also offered choice of unit snacks mid-morning and mid-afternoon.  Pt is eating as desired.   Lab results and medications reviewed.   Oran ReinLaura Florine Sprenkle, RD, LDN Clinical Inpatient Dietitian Pager:  [phone] Weekend and after hours pager:  [phone]

## 2014-11-03 NOTE — Progress Notes (Signed)
[facility] MD Progress Note  11/03/2014 3:30 PM Mary Greene  MRN:  [id number] Subjective:  Patient reports she is still depressed, anxious, but certainly much better than upon admission. She has complained of forearm/wrist pain ( history of seizure) , but  It is much better now that she is wearing a wrist brace . Objective: I have discussed case with treatment team and have met with patient. Patient is improved compared to yesterday. She presents with improved mood, improved range of affect, and states she is less upset . She  Has continued to report some anxiety and symptoms of withdrawal, such as feeling " jittery", but vitals are stable and does not appear to be in any acute distress at this time. Patient has been visible on unit and has been going to groups. Denies medication side effects. ( She has been started on Lamictal, as she reports a history of Bipolar Spectrum Disorder, even independent from benzodiazepine abuse) . We reviewed medication side effects and risk of rash. 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 Benzodiazepine Dependence  Total Time spent with patient: 20 minutes    ADL's:  Improved   Sleep: improved   Appetite: good   Suicidal Ideation:  Today denies any suicidal ideations Homicidal Ideation:  Today denies any homicidal ideations AEB (as evidenced by):  Psychiatric Specialty Exam: Physical Exam  Review of Systems  Constitutional: Negative for fever and chills.  Respiratory: Negative for cough and shortness of breath.   Cardiovascular: Negative for chest pain.  Skin: Negative for rash.  Psychiatric/Behavioral: Positive for depression and substance abuse. The patient is nervous/anxious.     Blood pressure 111/85, pulse 79, temperature 97.7 F (36.5 C), temperature source Oral, resp. rate 18, height 4' 11.75" (1.518 m), weight 99.791 kg (220 lb), last menstrual period  12/22/1993, SpO2 97 %.Body mass index is 43.31 kg/(m^2).  General Appearance: improved grooming   Eye Contact::  Good  Speech:  Normal Rate  Volume:  Normal  Mood:  still anxious, but improved compared to admission  Affect:  improving   Thought Process:  Goal Directed and Linear  Orientation:  Full (Time, Place, and Person)  Thought Content:  denies hallucinations, no delusions  Suicidal Thoughts:  No- At this time denies any suicidal plan or intent and contracts for safety on the unit  Homicidal Thoughts:  No  Memory:  recent and remote grossly intact   Judgement:  Fair  Insight:  Improved   Psychomotor Activity:  Normal  Concentration:  Good  Recall:  Good  Fund of Knowledge:Good  Language: Good  Akathisia:  Negative  Handed:  Right  AIMS (if indicated):     Assets:  Communication Skills Desire for Improvement Resilience  Sleep:  Number of Hours: 6.5   Musculoskeletal: Strength & Muscle Tone: within normal limits- no significant distal tremors, no diaphoresis, no acute distress or agitation Gait & Station: normal Patient leans: N/A  Current Medications: Current Facility-Administered Medications  Medication Dose Route Frequency Provider Last Rate Last Dose  . acetaminophen (TYLENOL) tablet 650 mg  650 mg Oral Q6H PRN Elmarie Shiley, NP   650 mg at 11/03/14 0805  . alum & mag hydroxide-simeth (MAALOX/MYLANTA) 200-200-20 MG/5ML suspension 30 mL  30 mL Oral Q4H PRN Elmarie Shiley, NP      . chlordiazePOXIDE (LIBRIUM) capsule 25 mg  25 mg Oral QID PRN Jenne Campus, MD      . hydrOXYzine (ATARAX/VISTARIL)  tablet 25 mg  25 mg Oral Q6H PRN Elmarie Shiley, NP   25 mg at 11/03/14 1101  . lamoTRIgine (LAMICTAL) tablet 25 mg  25 mg Oral Daily Jenne Campus, MD   25 mg at 11/03/14 0803  . magnesium hydroxide (MILK OF MAGNESIA) suspension 30 mL  30 mL Oral Daily PRN Elmarie Shiley, NP      . traZODone (DESYREL) tablet 50 mg  50 mg Oral QHS PRN Elmarie Shiley, NP   50 mg at 11/02/14 2151     Lab Results:  Results for orders placed or performed during the hospital encounter of 11/01/14 (from the past 48 hour(s))  TSH     Status: None   Collection Time: 11/03/14  6:21 AM  Result Value Ref Range   TSH 2.090 0.350 - 4.500 uIU/mL    Comment: Performed at [facility]    Physical Findings: AIMS: Facial and Oral Movements Muscles of Facial Expression: None, normal Lips and Perioral Area: None, normal Jaw: None, normal Tongue: None, normal,Extremity Movements Upper (arms, wrists, hands, fingers): None, normal Lower (legs, knees, ankles, toes): None, normal, Trunk Movements Neck, shoulders, hips: None, normal, Overall Severity Severity of abnormal movements (highest score from questions above): None, normal Incapacitation due to abnormal movements: None, normal Patient's awareness of abnormal movements (rate only patient's report): No Awareness, Dental Status Current problems with teeth and/or dentures?: Yes Does patient usually wear dentures?: No  CIWA:  CIWA-Ar Total: 1 COWS:  COWS Total Score: 1   Assessment: Patient partially improved compared to admission- less depressed, slowly improving anxiety, no severe withdrawal symptoms and stable vitals. Tolerating medications well. Remains very committed to tapering off benzodiazepines. Tolerating Lamictal well.  Treatment Plan Summary: Daily contact with patient to assess and evaluate symptoms and progress in treatment Medication management See below  Plan: Continue inpatient treatment, support, milieu Continue Lamictal 25 mgrs QDAY On Librium PRNs for potential BZD Withdrawal.    Medical Decision Making Problem Points:  Established problem, stable/improving (1), Review of last therapy session (1) and Review of psycho-social stressors (1) Data Points:  Review or order clinical lab tests (1) Review of medication regiment & side effects (2)  I certify that inpatient services furnished can reasonably be  expected to improve the patient's condition.   Siedah Sedor, [place] 11/03/2014, 3:30 PM

## 2014-11-03 NOTE — Progress Notes (Signed)
D   Pt is pleasant on approach   She reports her day was better than yesterday and was making more positive remarks  She has also been very supportive of her roommate   She attends and participates in groups  She interacts appropriately with her peers A   Verbal support given   Medications administered and effectiveness monitored   Q 15 min checks R   Pt safe at present

## 2014-11-03 NOTE — B[facility] Group Notes (Signed)
B[facility] LCSW Group Therapy 11/03/2014 1:15 PM Type of Therapy: Group Therapy Participation Level: Active  Participation Quality: Attentive, Sharing and Supportive  Affect: Depressed and Flat  Cognitive: Alert and Oriented  Insight: Developing/Improving and Engaged  Engagement in Therapy: Developing/Improving and Engaged  Modes of Intervention: Clarification, Confrontation, Discussion, Education, Exploration, Limit-setting, Orientation, Problem-solving, Rapport Building, Dance movement psychotherapisteality Testing, Socialization and Support  Summary of Progress/Problems: The topic for today was feelings about relapse. Pt discussed what relapse prevention is to them and identified triggers that they are on the path to relapse. Pt processed their feeling towards relapse and was able to relate to peers. Pt discussed coping skills that can be used for relapse prevention. Patient discussed her need to separate from negative relationships and focus on her well-being. CSW's and other group members provided emotional support and encouragement.    Samuella BruinKristin Pearson Reasons, MSW, [employer]CSWA Clinical Social Worker [facility] [phone]

## 2014-11-03 NOTE — Clinical Social Work Note (Signed)
Patient information faxed to [facility], voicemail left regarding scheduling new patient appointments. Awaiting return call.  Mary BruinKristin Nayleah Gamel, MSW, [employer]CSWA Clinical Social Worker [facility] [phone]

## 2014-11-03 NOTE — B[facility] Group Notes (Signed)
Adult Psychoeducational Group Note  Date:  11/03/2014 Time:  9:58 PM  Group Topic/Focus:  AA Meeting  Participation Level:  None  Participation Quality:  Attentive  Affect:  Flat  Cognitive:  Alert  Insight: None  Engagement in Group:  Lacking  Modes of Intervention:  Discussion and Education  Additional Comments:  Aram BeechamCynthia attended group.  Caroll RancherLindsay, Mazin Emma A 11/03/2014, 9:58 PM

## 2014-11-03 NOTE — Progress Notes (Signed)
Per patient's request and MD approval, patient's black wrist brace was given to patient out of her locker.  Later, patient stated her wrist was feeling better.  D:  Patient's self inventory sheet, patient has fair sleep, sleep medication is helpful.  Poor appetite, low energy level, poor concentration.  Rated depression and hopeless #6, anxiety 10.  Withdrawals continue with tremors, cramping, nausea, irritability.  Denied SI.  Physical problems pain, blurred vision.  Has experienced tremors, headache in past 24 hours.  Patient stated she would like to feel secure and lower anxiety.  Goal is to talk to MD/staff about breathing exercise, anxiety/depression.  Also plans to alk to MD about medications.  No discharge plans. A:  Medications administered per MD orders.  Emotional support  and encouragement given patient. R:  Denied SI and HI.  Denied A/V hallucinations.  Safety continues with 15 minute checks.

## 2014-11-03 NOTE — [facility] Group Notes (Signed)
   [facility] L[facility] Dis[facility] Planning Group Note  11/03/2014  8:45 AM   Participation Quality: Alert, Appropriate and Oriented  Mood/Affect: Depressed and Flat  Depression Rating: 7  Anxiety Rating: 8-9  Thoughts of Suicide: Pt denies SI/HI  Will you contract for safety? Yes  Current AVH: Pt denies  Plan for Discharge/Comments: Pt attended discharge planning group and actively participated in group. CSW provided pt with today's workbook. Patient reports that she is feeling anxious today but that she has noticed some improvements with her depression level. She plans to discharge to local shelter and is requesting referral to outpatient services in [place].  Transportation Means: Pt denies access to transportation- will be provided with bus pass at discharge  Supports: No supports mentioned at this time  Samuella BruinKristin Audrianna Driskill, MSW, [employer]CSWA Clinical Social Worker [facility] [phone]

## 2014-11-03 NOTE — Tx Team (Signed)
Interdisciplinary Treatment Plan Update (Adult) Date: 11/03/2014   Time Reviewed: 9:30 AM  Progress in Treatment: Attending groups: CSW continuing to assess Participating in groups: CSW continuing to assess Taking medication as prescribed: Yes Tolerating medication: Yes Family/Significant other contact made: Patient has declined for CSW to make collateral contact Patient understands diagnosis: Yes Discussing patient identified problems/goals with staff: Yes Medical problems stabilized or resolved: Yes Denies suicidal/homicidal ideation:Yes, denies Issues/concerns per patient self-inventory: Yes Other:  New problem(s) identified: N/A  Discharge Plan or Barriers: Patient is recently homeless and lacks support system. Patient agreeable to discharge to local shelter to follow up with therapy and medication management services in [place] yet to be determined.  Reason for Continuation of Hospitalization:  Depression Anxiety Medication Stabilization   Comments: N/A  Estimated length of stay: 3-5 days  For review of initial/current patient goals, please see plan of care. Patient is a 53 year old Caucasian female with a diagnosis of Major Depression, Recurrent severe, Alcohol Use Disorder, In Remission. Patient is from [place] and is recently homeless. Patient denies having any support system. Patient denies having any housing options, is agreeable to staying at a shelter in [place] at discharge. Stressors include loss of house, job, and lack of support. Patient reports feeling increasingly suicidal recently. Patient would like to be referred to outpatient providers in [place]. Her goals for treatment include detoxing from her prescriptions that she has been abusing and to "live again". Patient will benefit from crisis stabilization, medication evaluation, group therapy, and psycho education in addition to case management for discharge planning. Patient and CSW reviewed pt's  identified goals and treatment plan. Pt verbalized understanding and agreed to treatment plan.   Attendees: Patient:    Family:    Physician: Dr. Jama Flavorsobos 11/03/2014 9:30 AM  Nursing: Earl ManySara Twyman, Quintella ReichertBeverly Knight, RN 11/03/2014 9:30 AM  Clinical Social Worker: Samuella BruinKristin Magdaleno Lortie,  LCSWA 11/03/2014 9:30 AM  Other: Juline PatchQuylle Hodnett, LCSW 11/03/2014 9:30 AM  Other: Leisa LenzValerie Enoch, Vesta MixerMonarch Liaison 11/03/2014 9:30 AM  Other: Onnie BoerJennifer Clark, Case Manager 11/03/2014 9:30 AM  Other: Tomasita Morrowelora Sutton, P4CC 11/03/2014 9:30 AM  Other: Santa GeneraAnne Cunningham, LCSW 11/03/2014 9:30 AM  Other:    Other:    Other:    Other:         Scribe for Treatment Team:  Samuella BruinKristin Alphia Behanna, MSW, [employer]CSWA [phone]

## 2014-11-03 NOTE — Clinical Social Work Maternal (Signed)
CSW provided patient with information on local shelters, the [facility], [facility], and [facility]. Patient thanked CSW for the information, no further questions at this time.  Samuella BruinKristin Rand Boller, MSW, [employer] Clinical Social Worker [facility] [phone]

## 2014-11-03 NOTE — Progress Notes (Signed)
D:Patient in the dauroom interacting with peers on approach.  Patient states she had a better day today.  Patient states her goal for today was to have a better day than yesterday.  Patiet states she has noticed that vistaril works well for her anxiety. Patient states she felt she met her goal.  Patient denies SI/HI and denies AVH. A: Staff to monitor Q 15 mins for safety.  Encouragement and support offered.  Scheduled medications administered per orders.  Trazodone administered prn for sleep.   R: Patient remains safe on the unit.  Patient attended group tonight.  Patient visible on the unit and taking administered medciations.  Patient taking administered medications.

## 2014-11-04 MED ORDER — TRAZODONE HCL 100 MG PO TABS
100.0000 mg | ORAL_TABLET | Freq: Every evening | ORAL | Status: DC | PRN
  Administered 2014-11-04 – 2014-11-05 (×2): 100 mg via ORAL
  Filled 2014-11-04: qty 1

## 2014-11-04 NOTE — Progress Notes (Signed)
Adult Psychoeducational Group Note  Date:  11/04/2014 Time:  11:49 PM  Group Topic/Focus:  Wrap-Up Group:   The focus of this group is to help patients review their daily goal of treatment and discuss progress on daily workbooks.  Participation Level:  Active  Participation Quality:  Appropriate, Attentive and Sharing  Affect:  Appropriate  Cognitive:  Appropriate  Insight: Appropriate  Engagement in Group:  Engaged  Modes of Intervention:  Support  Additional Comments:  Pt attended group and stated that her goal is to get a good night's sleep.  Caswell CorwinOwen, Xzayvion Vaeth C 11/04/2014, 11:49 PM

## 2014-11-04 NOTE — [facility] Group Notes (Signed)
[facility] Group Notes:  Healthy coping skills  Date:  11/04/2014  Time:  2:02 PM  Type of Therapy:  Nurse Education  Participation Level:  Active  Participation Quality:  Appropriate  Affect:  Appropriate  Cognitive:  Alert  Insight:  Appropriate  Engagement in Group:  Engaged  Modes of Intervention:  Discussion  Summary of Progress/Problems:Pt stated her grandchildren help her to cope with life stressors.   Rodman KeyWebb, Aneesha Holloran [facility] 11/04/2014, 2:02 PM

## 2014-11-04 NOTE — Progress Notes (Signed)
Pt has been up for most groups today. She is interacting with peers and staff appropriately.  She rated both her depression and hopelessness a 8 and anxiety a 6 on her self-inventory. She denied any S/H ideation or A/V/H.  She has received vistaril at 1259 today for her anxiety.  Her goal today "to lower my anxiety by using breathing exercises, concentrating on what causes the anxiety".  She is unsure how she will get home and stated,"I am homeless and I have transportation issues"

## 2014-11-04 NOTE — [facility] Group Notes (Signed)
[facility] Group Notes:  Self inventory  Date:  11/04/2014  Time:  9:27 AM  Type of Therapy:  Nurse Education  Participation Level:  Active  Participation Quality:  Appropriate  Affect:  Appropriate  Cognitive:  Alert  Insight:  Appropriate  Engagement in Group:  Engaged  Modes of Intervention:  Education  Summary of Progress/Problems:  Mary Greene, Mary Greene 11/04/2014, 9:27 AM

## 2014-11-04 NOTE — Progress Notes (Signed)
D: Patient in the dayroom on approach.  Patient states she had a good day.  Patient states her anxiety has been high today but states it is more under control.  Patient states her goal for today was to deep breathe to hwlp with anxiety.  Patient states she had been taking vistaril and that has helped as well.  Patient denies AI/HI and denies AVH. A: Staff to monitor Q 15 mins for safety.  Encouragement and support offered.  Scheduled medications administered per orders.  Vistaril administered prn R: Patient remains safe on the unit.  Patient attended group tonight.  Patient visible on the unit and interacting with peers.  Patient taking administered medications.

## 2014-11-04 NOTE — Plan of Care (Signed)
Problem: Diagnosis: Increased Risk For Suicide Attempt Goal: STG-Patient Will Attend All Groups On The Unit Outcome: Progressing Pt has been up and attending all groups thus far. She plans to continue going to groups. She denies any suicidal ideation and does contact verbally to come to staff.

## 2014-11-04 NOTE — [facility] Group Notes (Signed)
[facility] LCSW Group Therapy  11/04/2014 12:12 PM  Type of Therapy:  Group Therapy  Participation Level:  Did Not Attend  Participation Quality:  N/A  Affect:  N/A  Cognitive:  N/A  Insight:  N/A  Engagement in Therapy:  N/A  Modes of Intervention:  Discussion, Education, Exploration, Rapport Building and Support  Summary of Progress/Problems: Pt did not attend  Seabron SpatesVaughn, Aristea Posada Anne 11/04/2014, 12:12 PM

## 2014-11-04 NOTE — Progress Notes (Signed)
[facility] MD Progress Note  11/04/2014 11:25 AM Mary Greene  MRN:  [id number]    Subjective:  Last night was a bad night and she only got about 2.5 hrs of sleep.  Objective: I have reviewed the chart and have met with patient.  Patient is worse compared to yesterday. She feels down and anxious.   Patient has been visible on unit and has been going to groups. Pt is benefiting from groups and being on the unit. Pt feels social support.   Denies medication side effects. Vistaril is helping to decrease her anxiety.  Diagnosis:  Bipolar Disorder Depressed versus Benzodiazepine Induced Mood Disorder, Depressed. Benzodiazepine Dependence  Total Time spent with patient: 20 minutes    ADL's:  Improved   Sleep: poor  Appetite: good   Suicidal Ideation:  Today denies any suicidal ideations Homicidal Ideation:  Today denies any homicidal ideations AEB (as evidenced by):  Psychiatric Specialty Exam: Physical Exam  Review of Systems  Constitutional: Negative for fever and chills.  Respiratory: Negative for cough and shortness of breath.   Cardiovascular: Negative for chest pain.  Skin: Negative for rash.  Psychiatric/Behavioral: Positive for depression and substance abuse. The patient is nervous/anxious.     Blood pressure 112/68, pulse 78, temperature 97.9 F (36.6 C), temperature source Oral, resp. rate 18, height 4' 11.75" (1.518 m), weight 99.791 kg (220 lb), last menstrual period 12/22/1993, SpO2 97 %.Body mass index is 43.31 kg/(m^2).  General Appearance: improved grooming   Eye Contact::  Good  Speech:  Normal Rate  Volume:  Normal  Mood:  Anxious and Depressed  Affect:  Congruent  Thought Process:  Goal Directed and Linear  Orientation:  Full (Time, Place, and Person)  Thought Content:  denies hallucinations, no delusions  Suicidal Thoughts:  No- At this time denies any suicidal plan or intent and contracts for safety on the unit  Homicidal Thoughts:  No  Memory:   recent and remote grossly intact   Judgement:  Fair  Insight:  Improved   Psychomotor Activity:  Normal  Concentration:  Good  Recall:  Good  Fund of Knowledge:Good  Language: Good  Akathisia:  Negative  Handed:  Right  AIMS (if indicated):     Assets:  Communication Skills Desire for Improvement Resilience  Sleep:  Number of Hours: 6.75   Musculoskeletal: Strength & Muscle Tone: within normal limits- no significant distal tremors, no diaphoresis, no acute distress or agitation Gait & Station: normal Patient leans: N/A  Current Medications: Current Facility-Administered Medications  Medication Dose Route Frequency Provider Last Rate Last Dose  . acetaminophen (TYLENOL) tablet 650 mg  650 mg Oral Q6H PRN Elmarie Shiley, NP   650 mg at 11/03/14 0805  . alum & mag hydroxide-simeth (MAALOX/MYLANTA) 200-200-20 MG/5ML suspension 30 mL  30 mL Oral Q4H PRN Elmarie Shiley, NP      . chlordiazePOXIDE (LIBRIUM) capsule 25 mg  25 mg Oral QID PRN Jenne Campus, MD      . hydrOXYzine (ATARAX/VISTARIL) tablet 25 mg  25 mg Oral Q6H PRN Elmarie Shiley, NP   25 mg at 11/04/14 0618  . lamoTRIgine (LAMICTAL) tablet 25 mg  25 mg Oral Daily Jenne Campus, MD   25 mg at 11/04/14 0810  . magnesium hydroxide (MILK OF MAGNESIA) suspension 30 mL  30 mL Oral Daily PRN Elmarie Shiley, NP      . traZODone (DESYREL) tablet 50 mg  50 mg Oral QHS PRN Elmarie Shiley, NP   50  mg at 11/03/14 2120    Lab Results:  Results for orders placed or performed during the hospital encounter of 11/01/14 (from the past 48 hour(s))  TSH     Status: None   Collection Time: 11/03/14  6:21 AM  Result Value Ref Range   TSH 2.090 0.350 - 4.500 uIU/mL    Comment: Performed at [facility]    Physical Findings: AIMS: Facial and Oral Movements Muscles of Facial Expression: None, normal Lips and Perioral Area: None, normal Jaw: None, normal Tongue: None, normal,Extremity Movements Upper (arms, wrists, hands, fingers): None,  normal Lower (legs, knees, ankles, toes): None, normal, Trunk Movements Neck, shoulders, hips: None, normal, Overall Severity Severity of abnormal movements (highest score from questions above): None, normal Incapacitation due to abnormal movements: None, normal Patient's awareness of abnormal movements (rate only patient's report): No Awareness, Dental Status Current problems with teeth and/or dentures?: Yes Does patient usually wear dentures?: No  CIWA:  CIWA-Ar Total: 1 COWS:  COWS Total Score: 1   Assessment: Patient partially improved compared to admission- less depressed, slowly improving anxiety, no severe withdrawal symptoms and stable vitals. Tolerating medications well. Remains very committed to tapering off benzodiazepines. Tolerating Lamictal well.  Treatment Plan Summary: Daily contact with patient to assess and evaluate symptoms and progress in treatment Medication management See below  Plan: Continue inpatient treatment, support, milieu Continue Lamictal 25 mgrs QDAY Continue Vistaril 64m q6 hrs prn anxiety Increase Trazodone to 1043mpo qHS prn insomnia On Librium PRNs for potential BZD Withdrawal.  Reviewed TSH-WNL  Medical Decision Making Problem Points:  Established problem, worsening (2), Review of last therapy session (1) and Review of psycho-social stressors (1) Data Points:  Review or order clinical lab tests (1) Review of medication regiment & side effects (2) Review of new medications or change in dosage (2)  I certify that inpatient services furnished can reasonably be expected to improve the patient's condition.   Jenita Rayfield 11/04/2014, 11:25 AM

## 2014-11-05 DIAGNOSIS — F3130 Bipolar disorder, current episode depressed, mild or moderate severity, unspecified: Principal | ICD-10-CM

## 2014-11-05 NOTE — Progress Notes (Signed)
[facility] MD Progress Note  11/05/2014 10:03 AM Mary Greene  MRN:  [id number]    Subjective: doing better  Objective: I have reviewed the chart and have met with patient. Pt has been attending groups and is visible on the unit. No disruptive behaviors noted.   Pt got about 5-6 hrs of sleep last night.   Anxiety is improving and level today is 7/10, as compared to yesterday which was a 8/10.   Mood is better. Pt is feeling more secure. Pt is homeless and is working on a plan for d/c. She would like to go to a women's program to learn coping skills.   Pt has 2 scripts for Ativan and Ambien pending. She plans to call her pharmacist and have the scripts d/c.   She has no social support.   Denies medication side effects. Vistaril is helping to decrease her anxiety.  Withdrawal symptoms have decreased to headaches and mild abdominal pain and heartburn.   Diagnosis:  Bipolar Disorder Depressed versus Benzodiazepine Induced Mood Disorder, Depressed. Benzodiazepine Dependence  Total Time spent with patient: 30 minutes    ADL's:  Improved   Sleep: good  Appetite:improving  Suicidal Ideation:  Today denies any suicidal ideations Homicidal Ideation:  Today denies any homicidal ideations AEB (as evidenced by):  Psychiatric Specialty Exam: Physical Exam  Review of Systems  Constitutional: Negative for fever and chills.  Respiratory: Negative for cough and shortness of breath.   Cardiovascular: Negative for chest pain.  Gastrointestinal: Positive for heartburn and abdominal pain.  Skin: Negative for rash.  Neurological: Positive for headaches.  Psychiatric/Behavioral: Positive for depression and substance abuse. The patient is nervous/anxious.     Blood pressure 107/78, pulse 79, temperature 97.9 F (36.6 C), temperature source Oral, resp. rate 20, height 4' 11.75" (1.518 m), weight 99.791 kg (220 lb), last menstrual period 12/22/1993, SpO2 97 %.Body mass index is 43.31  kg/(m^2).  General Appearance: improved grooming   Eye Contact::  Good  Speech:  Normal Rate  Volume:  Normal  Mood:  Anxious and Depressed  Affect:  Congruent brighter as compared to yesterday  Thought Process:  Goal Directed and Linear  Orientation:  Full (Time, Place, and Person)  Thought Content:  denies hallucinations, no delusions  Suicidal Thoughts:  No- At this time denies any suicidal plan or intent and contracts for safety on the unit  Homicidal Thoughts:  No  Memory:  recent and remote grossly intact   Judgement:  Fair  Insight:  Improved   Psychomotor Activity:  Normal  Concentration:  Good  Recall:  Good  Fund of Knowledge:Good  Language: Good  Akathisia:  Negative  Handed:  Right  AIMS (if indicated):     Assets:  Communication Skills Desire for Improvement Resilience  Sleep:  Number of Hours: 5.75   Musculoskeletal: Strength & Muscle Tone: within normal limits- no significant distal tremors, no diaphoresis, no acute distress or agitation Gait & Station: normal Patient leans: N/A  Current Medications: Current Facility-Administered Medications  Medication Dose Route Frequency Provider Last Rate Last Dose  . acetaminophen (TYLENOL) tablet 650 mg  650 mg Oral Q6H PRN Elmarie Shiley, NP   650 mg at 11/03/14 0805  . alum & mag hydroxide-simeth (MAALOX/MYLANTA) 200-200-20 MG/5ML suspension 30 mL  30 mL Oral Q4H PRN Elmarie Shiley, NP      . chlordiazePOXIDE (LIBRIUM) capsule 25 mg  25 mg Oral QID PRN Jenne Campus, MD      . hydrOXYzine (ATARAX/VISTARIL) tablet  25 mg  25 mg Oral Q6H PRN Elmarie Shiley, NP   25 mg at 11/05/14 0459  . lamoTRIgine (LAMICTAL) tablet 25 mg  25 mg Oral Daily Jenne Campus, MD   25 mg at 11/05/14 3736  . magnesium hydroxide (MILK OF MAGNESIA) suspension 30 mL  30 mL Oral Daily PRN Elmarie Shiley, NP      . traZODone (DESYREL) tablet 100 mg  100 mg Oral QHS PRN Charlcie Cradle, MD   100 mg at 11/04/14 2109    Lab Results:  No results found for  this or any previous visit (from the past 48 hour(s)).  Physical Findings: AIMS: Facial and Oral Movements Muscles of Facial Expression: None, normal Lips and Perioral Area: None, normal Jaw: None, normal Tongue: None, normal,Extremity Movements Upper (arms, wrists, hands, fingers): None, normal Lower (legs, knees, ankles, toes): None, normal, Trunk Movements Neck, shoulders, hips: None, normal, Overall Severity Severity of abnormal movements (highest score from questions above): None, normal Incapacitation due to abnormal movements: None, normal Patient's awareness of abnormal movements (rate only patient's report): No Awareness, Dental Status Current problems with teeth and/or dentures?: Yes Does patient usually wear dentures?: No  CIWA:  CIWA-Ar Total: 1 COWS:  COWS Total Score: 1   Assessment: Patient partially improved compared to admission- less depressed, slowly improving anxiety, no severe withdrawal symptoms and stable vitals. Tolerating medications well. Remains very committed to tapering off benzodiazepines. Tolerating Lamictal well.  Treatment Plan Summary: Daily contact with patient to assess and evaluate symptoms and progress in treatment Medication management See below  Plan: Continue inpatient treatment, support, milieu Continue Lamictal 25 mgrs QDAY Continue Vistaril 43m q6 hrs prn anxiety Trazodone 1082mpo qHS prn insomnia On Librium PRNs for potential BZD Withdrawal.   Medical Decision Making Problem Points:  Established problem, stable/improving (1), Review of last therapy session (1) and Review of psycho-social stressors (1) Data Points:  Review of medication regiment & side effects (2)  I certify that inpatient services furnished can reasonably be expected to improve the patient's condition.   Mary Greene, SAMonson1/15/2015, 10:03 AM

## 2014-11-05 NOTE — Progress Notes (Signed)
Pt awake, alert, and conversant today. Pleasant and cooperative.  States depression and anxiety at 7 today. Goal for today is to determine what is causing her anxiety and ways to avoid and cope.  Scheduled and prn meds given.  Will continue to monitor.

## 2014-11-05 NOTE — Progress Notes (Signed)
D Pt. Denies SI and HI, no complaints of pain or discomfort noted.  A Writer offered support and encouragement, discussed coping skills with pt.  R Pt. Remains safe on the unit,   Rates her anxiety and depression at a 6 or 7 stating mostly due to the unknown of where she will go at discharge.  Pt. States she was given a list of long term treatment centers and some were highlighted for her to call but half of them only took men.  Pt. States she will talk to her case manager tomorrow discussing more treatment centers. Pt. Reports she has been hospitalized 4 times in the recent months due to hand surgery, then c-dif, then pneumonia and now for depression and ativan abuse.

## 2014-11-05 NOTE — [facility] Group Notes (Signed)
[facility] LCSW Group Therapy  11/05/2014 10:00 AM   Type of Therapy:  Group Therapy  Participation Level:  Did Not Attend  Victoriana Aziz Horton, LCSW 11/05/2014 10:41 AM     

## 2014-11-05 NOTE — Progress Notes (Signed)
Pt did not attend the AA speaker meeting tonight. Pt was in her room.

## 2014-11-06 DIAGNOSIS — F1324 Sedative, hypnotic or anxiolytic dependence with sedative, hypnotic or anxiolytic-induced mood disorder: Secondary | ICD-10-CM

## 2014-11-06 MED ORDER — TRAZODONE HCL 150 MG PO TABS
150.0000 mg | ORAL_TABLET | Freq: Every evening | ORAL | Status: DC | PRN
  Administered 2014-11-06 – 2014-11-07 (×2): 150 mg via ORAL
  Filled 2014-11-06: qty 14
  Filled 2014-11-06 (×2): qty 1

## 2014-11-06 NOTE — Progress Notes (Signed)
D  Pt reports no withdrawal symptoms and  She said her mood has improved quite a bit   She expresses relief in the absence of withdrawal symptoms   She attends groups and participates appropriately   She interacts well and is supportive of her peers A   Verbal support and encouragement given   Medications administered and effectiveness monitored    Q 15 min checks R   Pt safe at present time  And was receptive to verbal support

## 2014-11-06 NOTE — Clinical Social Work Note (Signed)
Patient [facility] and [place].  Belenda CruiseKristin Emira Eubanks, MSW, [employer]CS[employer] Clinical Social Worker [facility] [phone]

## 2014-11-06 NOTE — [facility] Group Notes (Signed)
[facility] LCSW Group Therapy 11/06/2014  1:15 pm  Type of Therapy: Group Therapy Participation Level: Active  Participation Quality: Attentive, Sharing and Supportive  Affect: Depressed and Flat  Cognitive: Alert and Oriented  Insight: Developing/Improving and Engaged  Engagement in Therapy: Developing/Improving and Engaged  Modes of Intervention: Clarification, Confrontation, Discussion, Education, Exploration,  Limit-setting, Orientation, Problem-solving, Rapport Building, Dance movement psychotherapisteality Testing, Socialization and Support  Summary of Progress/Problems: Pt identified obstacles faced currently and processed barriers involved in overcoming these obstacles. Pt identified steps necessary for overcoming these obstacles and explored motivation (internal and external) for facing these difficulties head on. Pt further identified one area of concern in their lives and chose a goal to focus on for today. Patient identified her lack of communication as a barrier to her recovery. Patient discussed ways in which opening up about her emotions with others can be helpful. CSW's and other group members provided emotional support and encouragement.  Samuella BruinKristin Drea Jurewicz, MSW, [employer]CS[employer] Clinical Social Worker [facility] [phone]

## 2014-11-06 NOTE — Progress Notes (Signed)
Pt presents with flat affect and depressed mood. Pt a little anxious this morning d/t worrying about her discharge plans. Pt requesting long-term treatment. Pt verbalized that if she's discharge to home she may start back abusing her pills. Pt denies any adverse reaction to meds. Pt denies having a rash developed. Pt compliant with taking meds and attended morning group.  Medications administered as ordered per MD. Verbal support given. Pt encouraged to attend groups. Pt encouraged to talk to CSW about resources available for treatment.  Pt safety maintained. Pt receptive to treatment and invested in discharge planning.

## 2014-11-06 NOTE — Progress Notes (Signed)
Patient ID: Mary Greene, female   DOB: June 30, 1961, 53 y.o.   MRN: [id number] [facility] MD Progress Note  11/06/2014 6:02 PM DONICIA DRUCK  MRN:  [id number]    Subjective: Patient reports significant improvement.  Objective: I have discussed case with treatment team and have met with patient. She is improved compared to admission and is currently presenting with an improved mood and full range of affect. She is tolerating medications well and is denying any medication side effects. She is not presenting with significant withdrawal symptoms and is not tremulous or diaphoretic at this time. Her vitals are stable . She is future oriented and discussing disposition plans with SW.  Diagnosis:  Bipolar Disorder Depressed versus Benzodiazepine Induced Mood Disorder, Depressed. Benzodiazepine Dependence  Total Time spent with patient: 20 minutes    ADL's:  Improved   Sleep: good  Appetite:improving  Suicidal Ideation:  Today denies any suicidal ideations Homicidal Ideation:  Today denies any homicidal ideations AEB (as evidenced by):  Psychiatric Specialty Exam: Physical Exam  Review of Systems  Constitutional: Negative for fever and chills.  Respiratory: Negative for cough and shortness of breath.   Cardiovascular: Negative for chest pain.  Gastrointestinal: Positive for heartburn and abdominal pain.  Skin: Negative for rash.  Neurological: Positive for headaches.  Psychiatric/Behavioral: Positive for depression and substance abuse. The patient is nervous/anxious.     Blood pressure 97/70, pulse 93, temperature 97.4 F (36.3 C), temperature source Oral, resp. rate 18, height 4' 11.75" (1.518 m), weight 99.791 kg (220 lb), last menstrual period 12/22/1993, SpO2 97 %.Body mass index is 43.31 kg/(m^2).  General Appearance: Well Groomed  Engineer, water::  Good  Speech:  Normal Rate  Volume:  Normal  Mood:  improved and today seems euthymic  Affect:  Congruent brighter as compared  to yesterday  Thought Process:  Goal Directed and Linear  Orientation:  Full (Time, Place, and Person)  Thought Content:  denies hallucinations, no delusions  Suicidal Thoughts:  No- At this time denies any suicidal plan or intent and contracts for safety on the unit  Homicidal Thoughts:  No  Memory:  recent and remote grossly intact   Judgement:  Other:  improved  Insight:  Improved   Psychomotor Activity:  Normal  Concentration:  Good  Recall:  Good  Fund of Knowledge:Good  Language: Good  Akathisia:  Negative  Handed:  Right  AIMS (if indicated):     Assets:  Communication Skills Desire for Improvement Resilience  Sleep:  Number of Hours: 6.5   Musculoskeletal: Strength & Muscle Tone: within normal limits- no significant distal tremors, no diaphoresis, no acute distress or agitation Gait & Station: normal Patient leans: N/A  Current Medications: Current Facility-Administered Medications  Medication Dose Route Frequency Provider Last Rate Last Dose  . acetaminophen (TYLENOL) tablet 650 mg  650 mg Oral Q6H PRN Elmarie Shiley, NP   650 mg at 11/03/14 0805  . alum & mag hydroxide-simeth (MAALOX/MYLANTA) 200-200-20 MG/5ML suspension 30 mL  30 mL Oral Q4H PRN Elmarie Shiley, NP      . chlordiazePOXIDE (LIBRIUM) capsule 25 mg  25 mg Oral QID PRN Jenne Campus, MD      . hydrOXYzine (ATARAX/VISTARIL) tablet 25 mg  25 mg Oral Q6H PRN Elmarie Shiley, NP   25 mg at 11/06/14 1638  . lamoTRIgine (LAMICTAL) tablet 25 mg  25 mg Oral Daily Jenne Campus, MD   25 mg at 11/06/14 0742  . magnesium hydroxide (MILK OF MAGNESIA)  suspension 30 mL  30 mL Oral Daily PRN Elmarie Shiley, NP      . traZODone (DESYREL) tablet 150 mg  150 mg Oral QHS PRN Janett Labella, NP        Lab Results:  No results found for this or any previous visit (from the past 48 hour(s)).  Physical Findings: AIMS: Facial and Oral Movements Muscles of Facial Expression: None, normal Lips and Perioral Area: None,  normal Jaw: None, normal Tongue: None, normal,Extremity Movements Upper (arms, wrists, hands, fingers): None, normal Lower (legs, knees, ankles, toes): None, normal, Trunk Movements Neck, shoulders, hips: None, normal, Overall Severity Severity of abnormal movements (highest score from questions above): None, normal Incapacitation due to abnormal movements: None, normal Patient's awareness of abnormal movements (rate only patient's report): No Awareness, Dental Status Current problems with teeth and/or dentures?: No Does patient usually wear dentures?: No  CIWA:  CIWA-Ar Total: 1 COWS:  COWS Total Score: 1   Assessment: Patient continues to improve and at present is not presenting with any residual significant withdrawal symptoms and seems euthymic. Thus far she has tolerated Lamictal trial well..  Treatment Plan Summary: Daily contact with patient to assess and evaluate symptoms and progress in treatment Medication management See below  Plan: Continue inpatient treatment, support, milieu Continue Lamictal 25 mgrs QDAY  Continue Vistaril 57m q6 hrs prn anxiety Continue Trazodone 157mpo qHS prn insomnia Discharge soon as she continues to improve- she is interested in going an inpatient setting and [facility] [place] Problem [place]:  Established problem, stable/improving (1), Review of last therapy session (1) and Review of psycho-social stressors (1) Data Points:  Review of medication regiment & side effects (2)  I certify that inpatient services furnished can reasonably be expected to improve the patient's condition.   Riyanshi Wahab, FEWolverine1/16/2015, 6:02 PM

## 2014-11-06 NOTE — [facility] Group Notes (Signed)
[facility]   11/06/2014 10:07 AM    Participation Quality:  Appropraite  Mood/Affect:  Appropriate  Depression Rating:  7  Anxiety Rating:  7  Thoughts of Suicide:  No  Will you contract for safety?   NA  Current AVH:  No  Plan for Discharge/Comments:  Patient attended discharge planning group and actively participated in group. She reports symptoms being high due not knowing where she will go at discharge.  Patient given information on [facility].  Suicide prevention education reviewed and SPE document provided.   Transportation Means: Patient uses public transportation.   Supports:  Patient does not have a support system.   Keni Elison, Joesph JulyQuylle Hairston

## 2014-11-06 NOTE — Progress Notes (Signed)
Adult Psychoeducational Group Note  Date:  11/06/2014 Time: 10:00am Group Topic/Focus:  Wellness Toolbox:   The focus of this group is to discuss various aspects of wellness, balancing those aspects and exploring ways to increase the ability to experience wellness.  Patients will create a wellness toolbox for use upon discharge.  Participation Level:  Active  Participation Quality:  Appropriate and Attentive  Affect:  Appropriate  Cognitive:  Alert and Appropriate  Insight: Appropriate  Engagement in Group:  Engaged  Modes of Intervention:  Discussion and Education  Additional Comments:   Pt attended and participated in group. Question was asked What is the one thing you wish people understood about you and What is your best quality? Pt stated people say that Im standoffish and I am not I just need time to get to know someone because I have trust issues. My best quality is listening and caring about people.  Shelly BombardGarner, Mary Greene 11/06/2014, 4:09 PM

## 2014-11-07 NOTE — Plan of Care (Signed)
Problem: Ineffective individual coping Goal: STG: Patient will remain free from self harm Outcome: Progressing Pt has denied suicidal ideation today  Problem: Diagnosis: Increased Risk For Suicide Attempt Goal: LTG-Patient Will Report Absence of Withdrawal Symptoms LTG (by discharge): Patient will report absence of withdrawal symptoms.  Outcome: Progressing Pt said she did not have any withdrawal symptoms

## 2014-11-07 NOTE — [facility] Group Notes (Signed)
[facility] Group Notes:  Stress  Date:  11/07/2014  Time:  9:25 AM  Type of Therapy:  Nurse Education  Participation Level:  Active  Participation Quality:  Appropriate  Affect:  Appropriate  Cognitive:  Appropriate  Insight:  Appropriate  Engagement in Group:  Engaged  Modes of Intervention:  Discussion  Summary of Progress/Problems:Pt attended group  Rodman KeyWebb, Janara Klett [facility] 11/07/2014, 9:25 AM

## 2014-11-07 NOTE — Progress Notes (Signed)
Patient ID: Mary Greene, female   DOB: [birth year], 53 y.o.   MRN: [id number] [facility] MD Progress Note  11/07/2014 4:52 PM Mary Greene  MRN:  [id number]    Subjective:  Today feeling better and more "OK" with disposition plan. Earlier, as reported by staff, had exhibited increased anxiety due to not knowing where she was going to go after discharge.  Objective: I have discussed case with treatment team and have met with patient.  Patient is improved , and at this time seems euthymic, with a full range of affect. Earlier had been feeling anxious and apprehensive about disposition options but at this time she states she is "OK" and excited about disposition of going to [facility] for ongoing recovery. She reports being highly motivated in recovery and states she feels better now that she is completing detox. No disruptive behaviors on unit. No medication side effects ( on lamictal) - no rash.  Diagnosis:  Bipolar Disorder Depressed versus Benzodiazepine Induced Mood Disorder, Depressed. Benzodiazepine Dependence  Total Time spent with patient: 20 minutes    ADL's:  Improved   Sleep: good  Appetite:improving  Suicidal Ideation:  Today denies any suicidal ideations Homicidal Ideation:  Today denies any homicidal ideations AEB (as evidenced by):  Psychiatric Specialty Exam: Physical Exam  Review of Systems  Constitutional: Negative for fever and chills.  Respiratory: Negative for cough and shortness of breath.   Cardiovascular: Negative for chest pain.  Gastrointestinal: Positive for heartburn and abdominal pain.  Skin: Negative for rash.  Neurological: Positive for headaches.  Psychiatric/Behavioral: Positive for depression and substance abuse. The patient is nervous/anxious.     Blood pressure 88/60, pulse 87, temperature 97.6 F (36.4 C), temperature source Oral, resp. rate 20, height 4' 11.75" (1.518 m), weight 99.791 kg (220 lb), last menstrual period  12/22/1993, SpO2 97 %.Body mass index is 43.31 kg/(m^2).  General Appearance: Well Groomed  Engineer, water::  Good  Speech:  Normal Rate  Volume:  Normal  Mood:  improved and today seems euthymic  Affect:  Congruent anxious earlier but better at this time  Thought Process:  Goal Directed and Linear  Orientation:  Full (Time, Place, and Person)  Thought Content:  denies hallucinations, no delusions  Suicidal Thoughts:  No- At this time denies any suicidal plan or intent and contracts for safety on the unit  Homicidal Thoughts:  No  Memory:  recent and remote grossly intact   Judgement:  Other:  improved  Insight:  Improved   Psychomotor Activity:  Normal  Concentration:  Good  Recall:  Good  Fund of Knowledge:Good  Language: Good  Akathisia:  Negative  Handed:  Right  AIMS (if indicated):     Assets:  Communication Skills Desire for Improvement Resilience  Sleep:  Number of Hours: 6.25   Musculoskeletal: Strength & Muscle Tone: within normal limits- no significant distal tremors, no diaphoresis, no acute distress or agitation Gait & Station: normal Patient leans: N/A  Current Medications: Current Facility-Administered Medications  Medication Dose Route Frequency Provider Last Rate Last Dose  . acetaminophen (TYLENOL) tablet 650 mg  650 mg Oral Q6H PRN Elmarie Shiley, NP   650 mg at 11/03/14 0805  . alum & mag hydroxide-simeth (MAALOX/MYLANTA) 200-200-20 MG/5ML suspension 30 mL  30 mL Oral Q4H PRN Elmarie Shiley, NP      . chlordiazePOXIDE (LIBRIUM) capsule 25 mg  25 mg Oral QID PRN Jenne Campus, MD      . hydrOXYzine (ATARAX/VISTARIL) tablet  25 mg  25 mg Oral Q6H PRN Elmarie Shiley, NP   25 mg at 11/07/14 1114  . lamoTRIgine (LAMICTAL) tablet 25 mg  25 mg Oral Daily Jenne Campus, MD   25 mg at 11/07/14 0757  . magnesium hydroxide (MILK OF MAGNESIA) suspension 30 mL  30 mL Oral Daily PRN Elmarie Shiley, NP      . traZODone (DESYREL) tablet 150 mg  150 mg Oral QHS PRN Freda Munro May  Agustin, NP   150 mg at 11/06/14 2205    Lab Results:  No results found for this or any previous visit (from the past 48 hour(s)).  Physical Findings: AIMS: Facial and Oral Movements Muscles of Facial Expression: None, normal Lips and Perioral Area: None, normal Jaw: None, normal Tongue: None, normal,Extremity Movements Upper (arms, wrists, hands, fingers): None, normal Lower (legs, knees, ankles, toes): None, normal, Trunk Movements Neck, shoulders, hips: None, normal, Overall Severity Severity of abnormal movements (highest score from questions above): None, normal Incapacitation due to abnormal movements: None, normal Patient's awareness of abnormal movements (rate only patient's report): No Awareness, Dental Status Current problems with teeth and/or dentures?: No Does patient usually wear dentures?: No  CIWA:  CIWA-Ar Total: 1 COWS:  COWS Total Score: 1   Assessment: Patient has improved significantly since her admission. At this time seems motivated in sobriety and ongoing treatment and is not exhibiting any residual withdrawal symptoms. Earlier today was apprehensive about disposition options but as per SW has a bed tomorrow at [facility] in [place], which is a [facility] based residential recovery center. Patient is in agreement and optimistic about this dispo plan. ..  Treatment Plan Summary: Daily contact with patient to assess and evaluate symptoms and progress in treatment Medication management See below  Plan: Continue inpatient treatment, support, milieu Continue Lamictal 25 mgrs QDAY  Continue Vistaril 53m q6 hrs prn anxiety Continue Trazodone 1548mpo qHS prn insomnia Discharge [place] if stable[place] [facility] [place] Problem [place]:  Established problem, stable/improving (1), Review of last therapy session (1) and Review of psycho-social stressors (1) Data Points:  Review of medication regiment & side effects (2)  I  certify that inpatient services furnished can reasonably be expected to improve the patient's condition.   Denali Sharma, FEDayton1/17/2015, 4:52 PM

## 2014-11-07 NOTE — Plan of Care (Signed)
Problem: Ineffective individual coping Goal: LTG: Patient will report a decrease in negative feelings Outcome: Progressing Pt stated she is feeling more positive now that she knows she will [facility]. She does appear more at ease after speaking to social work and does appear as apprehensive.

## 2014-11-07 NOTE — Progress Notes (Addendum)
Pt wants to stay one more day to get a good night sleep. She is looking forward to [facility] and has social workers helping her with this. Pt states her depression is a 6/10 and her anxiety is a 8/10. She did attend group this am and does contract for safety. Pt denies SI and HI and contracts for safety.11am-Pt c/o feeling very nervous since she has not been able to [facility]. Spoke to [place] I[place]ila who told pt that she has never seen the [facility] refuse anyone. Pt did appear calmer after she spoke with social work. 5:20pm-Pt was given 25mg  of visteral for her nerves.

## 2014-11-07 NOTE — [facility] Group Notes (Signed)
[facility] LCSW Group Therapy  11/07/2014   1:15 PM   Type of Therapy:  Group Therapy  Participation Level:  Active  Participation Quality:  Attentive, Sharing and Supportive  Affect:  Depressed and Flat  Cognitive:  Alert and Oriented  Insight:  Developing/Improving and Engaged  Engagement in Therapy:  Developing/Improving and Engaged  Modes of Intervention:  Clarification, Confrontation, Discussion, Education, Exploration, Limit-setting, Orientation, Problem-solving, Rapport Building, Dance movement psychotherapisteality Testing, Socialization and Support  Summary of Progress/Problems: The topic for group therapy was feelings about diagnosis.  Pt actively participated in group discussion on their past and current diagnosis and how they feel towards this.  Pt also identified how society and family members judge them, based on their diagnosis as well as stereotypes and stigmas.  Patient reports that gaining knowledge regarding her bipolar diagnosis has been helpful to her in recognizing her triggers and engaging in treatment. CSW's and other group members provided emotional support and encouragement.  Samuella BruinKristin Marquel Pottenger, MSW, [employer]CSWA Clinical Social Worker [facility] [phone]

## 2014-11-07 NOTE — Tx Team (Signed)
Interdisciplinary Treatment Plan Update (Adult) Date: 11/07/2014   Time Reviewed: 9:30 AM  Progress in Treatment: Attending groups: Yes Participating in groups: Yes Taking medication as prescribed: Yes Tolerating medication: Yes Family/Significant other contact made: Patient has declined for CSW to make collateral contact Patient understands diagnosis: Yes Discussing patient identified problems/goals with staff: Yes Medical problems stabilized or resolved: Yes Denies suicidal/homicidal ideation:Yes, denies Issues/concerns per patient self-inventory: Yes Other:  New problem(s) identified: N/A  Discharge Plan or Barriers: Patient is recently homeless and lacks support system. Patient agreeable to [facility] to follow up with therapy and medication management services [place] at [facility].  Reason for Continuation of Hospitalization:  Depression Anxiety Medication Stabilization   Comments: N/A  Estimated length of stay: Discharge anticipated for tomorrow 11/08/14.  For review of initial/current patient goals, please see plan of care. Patient is a 53 year old Caucasian female with a diagnosis of Major Depression, Recurrent severe, Alcohol Use Disorder, In Remission. [place] from [place] and is recently homeless. Patient denies having any support system. Patient denies having any housing options, is agreeable to staying at [place] shelter [place] at [place]. Stressors include loss of house, job, and lack of support. Patient reports feeling increasingly suicidal recently. Patient would like to be referred to outpatient providers in [place]. Her goals for treatment include detoxing from her prescriptions that she has been abusing and to "live again". Patient will benefit from crisis stabilization, medication evaluation, group therapy, and psycho education in addition to case management for discharge planning. Patient and CSW reviewed pt's  identified goals and treatment plan. Pt verbalized understanding and agreed to treatment plan.   Attendees: Patient:    Family:    Physician: Dr. Jama Flavorsobos 11/07/2014 9:30 AM  Nursing: Burnetta SabinBrittany Tyson, Norville Haggardaroline B., Janet Marshall, RN 11/07/2014 9:30 AM  Clinical Social Worker: Belenda CruiseKristin Fay Swider,  LCSWA 11/07/2014 9:30 AM  Other: Juline PatchQuylle Hodnett, LCSW 11/07/2014 9:30 AM  Other: Leisa LenzValerie Enoch, [facility] Liaison 11/07/2014 9:30 AM  Other: Sydell AxonElaina, Pharmacist  11/07/2014 9:30 AM  Other:  11/07/2014 9:30 AM  Other:  11/07/2014 9:30 AM  Other:    Other:    Other:    Other:           Scribe for Treatment Team:  Samuella BruinKristin Jasmina Gendron, MSW, [employer]CSWA [phone]

## 2014-11-07 NOTE — Progress Notes (Signed)
Recreation Therapy Notes  Animal-Assisted Activity/Therapy (AAA/T) Program Checklist/Progress Notes Patient Eligibility Criteria Checklist & Daily Group note for Rec Tx Intervention  Date: 11.17.2015 Time: 2:45pm Location: [address] Dayroom    AAA/T Program Assumption of Risk Form signed by Patient/ or Parent Legal Guardian yes  Patient is free of allergies or sever asthma yes  Patient reports no fear of animals yes  Patient reports no history of cruelty to animals yes   Patient understands his/her participation is voluntary yes  Behavioral Response: Did not attend.   Marykay Lexenise L Danene Montijo, LRT/CTRS  Fitz Matsuo L 11/07/2014 4:09 PM

## 2014-11-07 NOTE — Progress Notes (Signed)
Pt attended spiritual care group on grief and loss facilitated by chaplain Burnis KingfisherMatthew Merritt Kibby and counseling intern [place]Jordan Austin. Group opened with brief discussion and psycho-social ed around grief and loss in relationships and in relation to self - identifying life patterns, circumstances, changes that cause losses. Established group norm of speaking from own life experience. Group goal of establishing open and affirming space for members to share loss and experience with grief, normalize grief experience and provide psycho social education and grief support.   Present through group, with exception of being pulled for discharge planning with SW.  At beginning of group, Arline AspCindy offered observation that "grief is exhausting."  Facilitator acknowledged this and expounded on physical, emotional, spiritual exhaustion of grief journey.  Arline AspCindy did not expound on her own sense of exhaustion.  Throughout group she had supportive non-verbals (nodding head and offering affirmation through body language).  When prompted about her experience of other members, Arline AspCindy described being preoccupied with anxiety around "what is happening next."  Described loss of relationships and possessions and "starting over."   She wondered about "finding courage" to move forward.  When group reflected on "what gives me courage to step into unkown," Arline AspCindy was not able to identify source of courage.    Spoke with another group member who is also [facility].   Belva CromeStalnaker, Miyuki Rzasa Wayne MDiv  12:16 PM  11/07/2014

## 2014-11-07 NOTE — Clinical Social Work Note (Signed)
Patient provided with clothing.  Samuella BruinKristin Adjoa Althouse, MSW, [employer]CSWA Clinical Social Worker [facility] [phone]

## 2014-11-08 DIAGNOSIS — F1320 Sedative, hypnotic or anxiolytic dependence, uncomplicated: Secondary | ICD-10-CM | POA: Insufficient documentation

## 2014-11-08 DIAGNOSIS — F3130 Bipolar disorder, current episode depressed, mild or moderate severity, unspecified: Secondary | ICD-10-CM | POA: Insufficient documentation

## 2014-11-08 MED ORDER — TRAZODONE HCL 150 MG PO TABS: 150.0000 mg | ORAL_TABLET | Freq: Every evening | ORAL | Status: AC | PRN

## 2014-11-08 MED ORDER — LAMOTRIGINE 25 MG PO TABS: 25.0000 mg | ORAL_TABLET | Freq: Every day | ORAL | Status: AC

## 2014-11-08 MED ORDER — ALBUTEROL SULFATE HFA 108 (90 BASE) MCG/ACT IN AERS: 2.0000 | INHALATION_SPRAY | RESPIRATORY_TRACT | Status: AC | PRN

## 2014-11-08 NOTE — Discharge Summary (Signed)
Physician Discharge Summary Note  Patient:  Mary Greene is an 53 y.o., female MRN:  [id number] DOB:  13-Jul-1961 Patient phone:  [phone] (home)  Patient address:   [address] [place] [address],  Total Time spent with patient: 30 minutes  Date of Admission:  11/01/2014 Date of Discharge: 11/08/14  Reason for Admission:  Mood stabilization treatments   Discharge Diagnoses: Active Problems:   Benzodiazepine dependence   Bipolar I disorder, most recent episode depressed  Psychiatric Specialty Exam: Physical Exam  Review of Systems  Constitutional: Negative.   HENT: Negative.   Eyes: Negative.   Respiratory: Negative.   Cardiovascular: Negative.   Gastrointestinal: Negative.   Genitourinary: Negative.   Musculoskeletal: Negative.   Skin: Negative.   Neurological: Negative.   Endo/Heme/Allergies: Negative.   Psychiatric/Behavioral: Positive for depression (Stabilized ) and suicidal ideas (Stabilized ).    Blood pressure 100/61, pulse 87, temperature 97.8 F (36.6 C), temperature source Oral, resp. rate 17, height 4' 11.75" (1.518 m), weight 99.791 kg (220 lb), last menstrual period 12/22/1993, SpO2 97 %.Body mass index is 43.31 kg/(m^2).  See Physician SRA     Past Psychiatric History: See H&P Diagnosis:  Hospitalizations:  Outpatient Care:  Substance Abuse Care:  Self-Mutilation:  Suicidal Attempts:  Violent Behaviors:   Musculoskeletal: Strength & Muscle Tone: within normal limits Gait & Station: normal Patient leans: N/A  DSM5:  AXIS I: Bipolar Disorder Depressed versus Benzodiazepine Induced Mood Disorder, Depressed. Benzodiazepine Dependence  AXIS II: Deferred AXIS III:  Past Medical History  Diagnosis Date  . Anxiety   . Depression   . Bipolar 1 disorder   . Pneumonia   . Non-functioning kidney     right  . Panic attacks   . Broken wrist 06/2014    Pt reports that she broke her wrist in  July 2015   AXIS IV: Recent loss of job, recent wrist fracture, Financial difficulties AXIS V: 65-70 upon discharge  Level of Care:  OP  Hospital Course:  Mary Greene is a 53 year old female. She has been on Ativan for a period of months to years. She was apparently abusing them. She states she recently "blacked out Or something", and when she realized what had happened , she had apparently taken all the Ativan prescribed for a month within just a few days or less. She states she realizes she has become dependent on these and has wanted to taper off, stop them if possible.She states she last took Ativan a few days ago. Patient also states she has been facing multiple stressors, which started several weeks ago when she fractured her wrist , needed surgery, and subsequently lost her job. In the context of these stressors she was feeling more depressed and started feeling suicidal , with a thought of driving her car into a tree- this resulted in her admission.         Mary Greene was admitted to the adult unit where she was evaluated and her symptoms were identified. Medication management was discussed and implemented. The patient was not taking any psychiatric medications prior to admission. Patient was ordered Librium 25 mg qid prn withdrawal symptoms from Ativan use. She was started on Lamictal 25 mg daily for improved mood stability. There was no evidence of a rash developing during her treatment. She was ordered Trazodone 100 mg hs prn insomnia. She was encouraged to participate in unit programming. Medical problems were identified and treated appropriately. Home medication was  restarted as needed.  She was evaluated each day by a clinical provider to ascertain the patient's response to treatment.  Improvement was noted by the patient's report of decreasing symptoms, improved sleep and appetite, affect, medication tolerance, behavior, and participation in unit programming.  The patient was  asked each day to complete a self inventory noting mood, mental status, pain, new symptoms, anxiety and concerns.         She responded well to medication and being in a therapeutic and supportive environment. Positive and appropriate behavior was noted and the patient was motivated for recovery.  She worked closely with the treatment team and case manager to develop a discharge plan with appropriate goals. Coping skills, problem solving as well as relaxation therapies were also part of the unit programming. Patient was initially anxious about her discharge disposition. She felt much more confident after finding out she had been [facility].          By the day of discharge she was in much improved condition than upon admission.  Symptoms were reported as significantly decreased or resolved completely. The patient denied SI/HI and voiced no AVH. She was motivated to continue taking medication with a goal of continued improvement in mental health. Mary Greene was discharged home with a plan to follow up as noted below. Patient was provided with medication samples and prescriptions at time of discharge. She left [facility] in no acute distress with all belongings returned to her.   Consults:  None  Significant Diagnostic Studies:  Chemistry panel, CBC, TSH, UDS positive for benzos,   Discharge Vitals:   Blood pressure 100/61, pulse 87, temperature 97.8 F (36.6 C), temperature source Oral, resp. rate 17, height 4' 11.75" (1.518 m), weight 99.791 kg (220 lb), last menstrual period 12/22/1993, SpO2 97 %. Body mass index is 43.31 kg/(m^2). Lab Results:   No results found for this or any previous visit (from the past 72 hour(s)).  Physical Findings: AIMS: Facial and Oral Movements Muscles of Facial Expression: None, normal Lips and Perioral Area: None, normal Jaw: None, normal Tongue: None, normal,Extremity Movements Upper (arms, wrists, hands, fingers):  None, normal Lower (legs, knees, ankles, toes): None, normal, Trunk Movements Neck, shoulders, hips: None, normal, Overall Severity Severity of abnormal movements (highest score from questions above): None, normal Incapacitation due to abnormal movements: None, normal Patient's awareness of abnormal movements (rate only patient's report): No Awareness, Dental Status Current problems with teeth and/or dentures?: No Does patient usually wear dentures?: No  CIWA:  CIWA-Ar Total: 1 COWS:  COWS Total Score: 1  Psychiatric Specialty Exam: See Psychiatric Specialty Exam and Suicide Risk Assessment completed by Attending Physician prior to discharge.  Discharge destination:  Home  Is patient on multiple antipsychotic therapies at discharge:  No   Has Patient had three or more failed trials of antipsychotic monotherapy by history:  No  Recommended Plan for Multiple Antipsychotic Therapies: NA     Medication List    STOP taking these medications        clindamycin 300 MG capsule  Commonly known as:  CLEOCIN      TAKE these medications      Indication   albuterol 108 (90 BASE) MCG/ACT inhaler  Commonly known as:  PROAIR HFA  Inhale 2 puffs into the lungs every 4 (four) hours as needed.   Indication:  Disease involving Spasms of the Lungs     GOODYS EXTRA STRENGTH 500-325-65 MG  Pack  Generic drug:  Aspirin-Acetaminophen-Caffeine  Take 1 packet by mouth 4 (four) times daily as needed (pain).      lamoTRIgine 25 MG tablet  Commonly known as:  LAMICTAL  Take 1 tablet (25 mg total) by mouth daily.   Indication:  Manic-Depression     traZODone 150 MG tablet  Commonly known as:  DESYREL  Take 1 tablet (150 mg total) by mouth at bedtime as needed for sleep.   Indication:  Trouble Sleeping           Follow-up Information    Follow up with [facility] in Mental Health Care On 11/23/2014.   Why:  Medication management appointment with Jaquelyn Bitter, NP on Thursday Dec.  3rd at 12 pm. Please arrive by 11:45 am. Please bring your insurance card to appointment and call office if you need to reschedule.   Contact information:   [address] [place], [place] [address] Phone: [phone] Fax: [phone]      Follow up with [facility] in Mental Health Care On 12/04/2014.   Why:  Please present to therapy appointment on Monday Dec. 14th at 10 am with Carlean Jews. Please bring insurance card to appointment and call office if you need to reschedule.   Contact information:   [address] [place], [place] [address] Phone: [phone] Fax: [phone]      Follow-up recommendations:   Activity: As tolerated Diet: Regular Tests: NA Other: See below  Comments:   Take all your medications as prescribed by your mental healthcare provider.  Report any adverse effects and or reactions from your medicines to your outpatient provider promptly.  Patient is instructed and cautioned to not engage in alcohol and or illegal drug use while on prescription medicines.  In the event of worsening symptoms, patient is instructed to call the crisis hotline, 911 and or go to the nearest ED for appropriate evaluation and treatment of symptoms.  Follow-up with your primary care provider for your other medical issues, concerns and or health care needs.   Total Discharge Time:  Greater than 30 minutes.  SignedFransisca Kaufmann NP-C 11/08/2014, 1:08 PM   Patient seen, Suicide Assessment Completed.  Disposition Plan Reviewed

## 2014-11-08 NOTE — Plan of Care (Signed)
Problem: Alteration in mood Goal: LTG-Patient reports reduction in suicidal thoughts (Patient reports reduction in suicidal thoughts and is able to verbalize a safety plan for whenever patient is feeling suicidal)  Outcome: Adequate for Discharge Pt denies suicidal ideation Goal: LTG-Pt's behavior demonstrates decreased signs of depression 11/13: Goal not met: Pt presents with flat affect and depressed mood. Pt with depression rating of 7. Pt to show decreased sign of depression and a rating of 3 or less before d/c.   Tilden Fossa, MSW, [place] Worker [facility] [phone]     (Patient's behavior demonstrates decreased signs of depression to the point the patient is safe to return home and continue treatment in an outpatient setting)  Outcome: Progressing Pt expresses more hopefullness and reports her mood has improved

## 2014-11-08 NOTE — [facility] Group Notes (Signed)
[facility] LCSW Group Therapy 11/08/2014  1:15 PM Type of Therapy: Group Therapy Participation Level: Active  Participation Quality: Attentive, Sharing and Supportive  Affect: Depressed and Flat  Cognitive: Alert and Oriented  Insight: Developing/Improving and Engaged  Engagement in Therapy: Developing/Improving and Engaged  Modes of Intervention: Clarification, Confrontation, Discussion, Education, Exploration, Limit-setting, Orientation, Problem-solving, Rapport Building, Dance movement psychotherapisteality Testing, Socialization and Support  Summary of Progress/Problems: The topic for group today was emotional regulation. This group focused on both positive and negative emotion identification and allowed group members to process ways to identify feelings, regulate negative emotions, and find healthy ways to manage internal/external emotions. Group members were asked to reflect on a time when their reaction to an emotion led to a negative outcome and explored how alternative responses using emotion regulation would have benefited them. Group members were also asked to discuss a time when emotion regulation was utilized when a negative emotion was experienced. Patient discussed wanting to work on her trust issues and differentiating between constructive feedback and negative criticism. Patient discussed the need to focus on her wellbeing and distance herself from negative relationships. CSW provided emotional support and encouragement.  Mary BruinKristin Ajanae Greene, MSW, [employer]CSWA Clinical Social Worker [facility] [phone]

## 2014-11-08 NOTE — Progress Notes (Signed)
Patient ID: Mary Greene, female   DOB: 1961-07-28, 53 y.o.   MRN: [id number] She has been up and to groups interacting with peers and staff.. Self inventory: depression 5, hopelessness 5, anxiety 7, denies withdrawals, pain and SI thoughts. Goal is to get to where I am going and settle down and to get with Child psychotherapistsocial worker.

## 2014-11-08 NOTE — [facility] Suicide Risk Assessment (Signed)
Demographic Factors:  53 year old female, divorced, one adult child, currently not employed  Total Time spent with patient: 30 minutes  Psychiatric Specialty Exam: Physical Exam  ROS  Blood pressure 100/61, pulse 87, temperature 97.8 F (36.6 C), temperature source Oral, resp. rate 17, height 4' 11.75" (1.518 m), weight 99.791 kg (220 lb), last menstrual period 12/22/1993, SpO2 97 %.Body mass index is 43.31 kg/(m^2).  General Appearance: Well Groomed  Patent attorneyye Contact::  Good  Speech:  Normal Rate  Volume:  Normal  Mood:  Euthymic  Affect:  Appropriate and Full Range  Thought Process:  Goal Directed and Linear  Orientation:  Full (Time, Place, and Person)  Thought Content:  no hallucinations, no delusions  Suicidal Thoughts:  No at this time denies any thoughts of hurting self and  contracts for safety on unit   Homicidal Thoughts:  No  Memory:  Recent and Remote grossly intact  Judgement:  Other:  improved  Insight:  Present  Psychomotor Activity:  Normal  Concentration:  Good  Recall:  Good  Fund of Knowledge:Good  Language: Good  Akathisia:  Negative  Handed:  Right  AIMS (if indicated):     Assets:  Communication Skills Desire for Improvement Resilience  Sleep:  Number of Hours: 6.25    Musculoskeletal: Strength & Muscle Tone: within normal limits Gait & Station: normal Patient leans: N/A   Mental Status Per Nursing Assessment::   On Admission:  Suicidal ideation indicated by patient  Current Mental Status by Physician: As above, patient much improved compared to her admission status. At this time presents euthymic, with a full range of affect, no thought disorder, not suicidal or homicidal or psychotic and is future oriented, no ongoing withdrawal symptoms noted and vitals are stable at present.  Loss Factors: Financial difficulties, unemployment, homelessness  Historical Factors: Has been diagnosed with Bipolar Disorder in the past, and one suicide attempt  in the past   Risk Reduction Factors:   Sense of responsibility to family and Positive coping skills or problem solving skills  Continued Clinical Symptoms:  As above, much improved at present, not depressed, future oriented, and not endorsing or presenting with ongoing withdrawal symptoms  Cognitive Features That Contribute To Risk:  No gross cognitive deficits noted upon discharge. Is alert , attentive, and oriented x 3   Suicide Risk:  Mild:  Suicidal ideation of limited frequency, intensity, duration, and specificity.  There are no identifiable plans, no associated intent, mild dysphoria and related symptoms, good self-control (both objective and subjective assessment), few other risk factors, and identifiable protective factors, including available and accessible social support.  Discharge Diagnoses:   AXIS I:   Bipolar Disorder Depressed versus Benzodiazepine Induced Mood Disorder, Depressed. Benzodiazepine Dependence  AXIS II:  Deferred AXIS III:   Past Medical History  Diagnosis Date  . Anxiety   . Depression   . Bipolar 1 disorder   . Pneumonia   . Non-functioning kidney     right  . Panic attacks   . Broken wrist 06/2014    Pt reports that she broke her wrist in July 2015   AXIS IV:  Recent loss of job, recent wrist fracture,  Financial difficulties AXIS V:  65-70 upon discharge  Plan Of Care/Follow-up recommendations:  Activity:  As tolerated Diet:  Regular Tests:  NA Other:  See below  Is patient on multiple antipsychotic therapies at discharge:  No   Has Patient had three or more failed trials of antipsychotic  monotherapy by history:  No  Recommended Plan for Multiple Antipsychotic Therapies: NA  Patient is being discharged in good spirits. She plans to follow [facility]. Follow up as below [facility] On 11/23/2014.    Why: Medication management appointment with Jaquelyn BitterPatricia  McKissic, NP on Thursday Dec. 3rd at 12 pm. Please arrive by 11:45 am. Please bring your insurance card to appointment and call office if you need to reschedule.   Contact information:   [address] [place], [place] [address] Phone: [phone] Fax: [phone]      [facility] On 12/04/2014.   Why: Please present to therapy appointment on Monday Dec. 14th at 10 am with Carlean JewsKelly Hotchkiss. Please bring insurance card to appointment and call office if you need to reschedule.   Contact information:   [address] [place], [place] [address] Phone: [phone] Fax: [phone]          Nehemiah MassedCOBOS, Mary Greene 11/08/2014, 11:07 AM

## 2014-11-08 NOTE — Progress Notes (Signed)
Patient ID: Mary Greene, female   DOB: 15-Jan-1961, 53 y.o.   MRN: [id number] She was discharged 1645. Was given the ticket and money. She voiced understanding of discharge teaching about medication and follow up care plan. She denies SI and all belongs were taken home with her.

## 2014-11-08 NOTE — Progress Notes (Signed)
[facility] :  Will you be returning to the same living situation after discharge: No. Patient will [facility] At discharge, do you have transportation home?:Yes,  hospital to assist with [facility] Do you have the ability to pay for your medications:Yes,  patient will be provided with medication samples and prescriptions at discharge  Release of information consent forms completed and in the chart;  Patient's signature needed at discharge.  Patient to Follow up at: Follow-up Information    [facility] On 11/23/2014.   Why:  Medication management appointment with Jaquelyn BitterPatricia McKissic, NP on Thursday Dec. 3rd at 12 pm. Please arrive by 11:45 am. Please bring your insurance card to appointment and call office if you need to reschedule.   Contact information:   [address] [place], [place] [address] Phone: [phone] Fax: [phone]      [facility] On 12/04/2014.   Why:  Please present to therapy appointment on Monday Dec. 14th at 10 am with Carlean JewsKelly Hotchkiss. Please bring insurance card to appointment and call office if you need to reschedule.   Contact information:   [address] [place], [place] [address] Phone: [phone] Fax: [phone]      Patient denies SI/HI:   Yes,  denies    Safety Planning and Suicide Prevention discussed:  Yes,  with patient  Hermena Swint, [place] CarboKristin L 11/08/2014, 11:34 AM

## 2014-11-08 NOTE — Progress Notes (Signed)
D   Pt is appropriate and pleasant  She is anxious but reports feeling excited about being discharged tomorrow   She attends groups and interacts well with others   She denies suicidal ideation A   Verbal support given   Medications administered and effectiveness monitored   Q 15 min checks R   Pt safe at present and receptive to verbal support

## 2014-11-08 NOTE — [facility] Group Notes (Signed)
   [facility] Note  11/08/2014  8:45 AM   Participation Quality: Alert, Appropriate and Oriented  Mood/Affect: Appropriate  Depression Rating: 4/5  Anxiety Rating: 7  Thoughts of Suicide: Pt denies SI/HI  Will you contract for safety? Yes  Current AVH: Pt denies  Plan for Discharge/Comments: Pt attended discharge planning group and actively participated in group. CSW provided pt with today's workbook. Patient agreeable to [facility]. She reports feeling "better" and agreeable to follow [facility] for therapy and medication management.  Transportation Means: Pt reports access to transportation- hospital to assist with transportation  Supports: No supports mentioned at this time  Samuella BruinKristin Rowynn Mcweeney, MSW, [employer]CSWA Clinical Social Worker [facility] [phone]

## 2014-11-13 NOTE — Progress Notes (Signed)
Patient Discharge Instructions:  After Visit Summary (AVS):   Faxed to:  11/13/14 Discharge Summary Note:   Faxed to:  11/13/14 Psychiatric Admission Assessment Note:   Faxed to:  11/13/14 Suicide Risk Assessment - Discharge Assessment:   Faxed to:  11/13/14 Faxed/Sent to the Next Level Care provider:  11/13/14 Faxed to [facility] @ [phone] Faxed to [facility] @ [phone]   Jerelene ReddenSheena E Lluveras, 11/13/2014, 2:26 PM

## 2015-04-14 NOTE — Op Note (Signed)
PATIENT NAME:  Mary Greene, Mary Greene MR#:  [id number] DATE OF BIRTH:  08/25/1961  DATE OF OPERATION:  07/24/2014  PREOPERATIVE DIAGNOSIS: Displaced, angulated left distal radius fracture with ulnar styloid fracture.   POSTOPERATIVE DIAGNOSIS: Displaced, angulated left distal radius fracture with ulnar styloid fracture.   OPERATION: Open reduction and internal fixation left distal radius fracture with volar plate and screws.   SURGEON: Valinda HoarHoward E. Juwon Scripter, Greene.D.   ASSISTANT: Durward MallardErin Powell, PA student.   ANESTHESIA: General LMA.   COMPLICATIONS: None.   DRAINS: None.   ESTIMATED BLOOD LOSS: None.   BLOOD REPLACED:  None.   DESCRIPTION OF PROCEDURE: The patient was brought to the operating room where she underwent satisfactory general LMA anesthesia in the supine position. The left arm was prepped and draped in sterile fashion. An Esmarch was applied. The tourniquet was inflated to 250 mmHg. Tourniquet time was 61 minutes.   A longitudinal volar radial incision was made directly over the flexor carpi radialis tendon. Dissection was carried down bluntly through subcutaneous tissue down to the flexor carpi radialis tendon sheath, which was divided. The tendon was retracted ulnarly along with the muscles and neural structures. Blunt dissection was carried out down to the volar radius carrying all the flexor muscles ulnarly. The brachioradialis tendon was identified and freed up and the pronator quadratus insertion was then divided from distal to proximal.  A periosteal elevator was used to expose the volar aspect of the radius. Fracture lines were identified. There was some early callus. The wounds were irrigated. An osteotome was used to reopen the fracture site of the distal radius to free up the fragments for repositioning. There was a large volar fragment along the shaft, which was essentially nondisplaced and was not freely mobile and was left in place. After mobilizing the distal fragment, it was  brought back out to length and into more volar inclination. Fluoroscopy showed that we were able to reduce it well.   At this point a left standard volar distal radial plate was placed on the volar aspect of the radius and with the fracture reduced the plate was fixed to the shaft with 2 screws. Fluoroscopy showed these to be in good position. The distal fragment was then fixed with 3 screws in a distal row and 2 screws in a proximal row, which were semi-threaded lag screws. Fluoroscopy showed all screws to be in good position without penetrating the joint and the reduction was excellent. The proximal 2 cortical screws were inserted in the shaft and final fluoroscopy taken.   The wound was irrigated. The quadratus fascia was loosely repaired to the brachioradialis to cover the plate. The subcutaneous tissue was closed with 4-0 Vicryl and the skin was closed with staples. Marcaine 0.5% was placed in the wound. A dry, sterile compression hand dressing with a sugar tong splint was applied. The wrist was supinated somewhat to protect the radioulnar joint. After tourniquet release blood flow was excellent to the hand.   The patient was then awakened and taken to recovery in good condition. It is planned to keep her overnight for observation for IV antibiotics and pain control as the patient does live alone.    ____________________________ Valinda HoarHoward E. Andrews Tener, MD hem:lt D: 07/24/2014 18:04:49 ET T: 07/25/2014 07:46:14 ET JOB#: [id number]  cc: Valinda HoarHoward E. Ferguson Gertner, MD, <Dictator> Valinda HoarHOWARD E Jini Horiuchi MD ELECTRONICALLY SIGNED 07/25/2014 13:02

## 2015-04-14 NOTE — Discharge Summary (Signed)
PATIENT NAME:  Mary BradyWILKIE, Aneth M MR#:  [id number] DATE OF BIRTH:  [birth year]  DATE OF ADMISSION:  10/08/2014 DATE OF DISCHARGE:  10/10/2014  DISCHARGE DIAGNOSES: 1.  Bilateral pneumonitis.  2.  Sepsis.  3.  Tobacco abuse.  4.  Possible underlying chronic obstructive pulmonary disease.   DISCHARGE MEDICATIONS: 1.  Lorazepam 1.5 mg orally 3 times a day.  2.  Ambien 5 mg orally once a day at bedtime as needed.  3.  Albuterol 2 puffs inhaled every 6 hours.  4.  Ibuprofen 400 mg orally 2 times a day as needed.  5.  Tussionex 8/10 mg per 5 mL 5 mL 2 times a day as needed for cough.  6.  Guaifenesin 600 mg orally 2 times a day.  7.  Prednisone 60 mg tapered over 10 days.  8.  Cefpodoxime 200 mg orally 2 times a day for 5 days.   DISCHARGE INSTRUCTIONS:  Low-fat, low-cholesterol diet. Activity is as tolerated. Follow up with primary physician in 1 to 2 weeks and quit smoking.   IMAGING STUDIES DONE INCLUDE:  1.  Chest x-ray, which was normal.  2.  CT scan of the chest showed no pulmonary embolism but did show bilateral pulmonary infiltrates.   ADMITTING HISTORY AND PHYSICAL:  Please see detailed H and P dictated previously. In brief, a 54 year old Caucasian female patient with tobacco abuse, who presented to the hospital complaining of cough and shortness of breath. The patient's CT showed bilateral infiltrates and failed outpatient therapy. She was admitted to the hospitalist service.   HOSPITAL COURSE: Bilateral pneumonia. The patient was started on ceftriaxone considering her allergy list, which was quite extensive. She has improved well. A good sputum sample could not be obtained from the patient. The only sputum sample that could be obtained was contaminated and could not be cultured.   By the day of discharge, the patient has improved well, has ambulated on room air, feels fine, no shortness of breath, still has cough but much improved, afebrile, normal white count, and is being  discharged home in a stable condition.   Prior to discharge, the patient has no wheezing, S1 and S2 heard without any murmurs, no edema, and abdomen is soft.   TIME SPENT ON DAY OF DISCHARGE IN DISCHARGE ACTIVITY:  40 minutes.    ____________________________ Molinda BailiffSrikar R. Karnisha Lefebre, MD srs:nb D: 10/12/2014 13:30:37 ET T: 10/13/2014 02:05:49 ET JOB#: [id number]  cc: Wardell HeathSrikar R. Hondo Nanda, MD, <Dictator> Orie FishermanSRIKAR R Charlaine Utsey MD ELECTRONICALLY SIGNED 10/18/2014 10:46

## 2015-04-14 NOTE — H&P (Signed)
PATIENT NAME:  Mary Greene MR#:  [id number] DATE OF BIRTH:  11/23/1961  DATE OF ADMISSION:  10/08/2014  CHIEF COMPLAINT: Cough, shortness of breath.   HISTORY OF PRESENT ILLNESS: A 54 year old, Caucasian female patient, with history of tobacco abuse, presented to the Emergency Room complaining of a week of cough. The patient was initially seen at the [facility] twice, initially started on Tessalon Perles. Later started on Bactrim. With this, her symptoms have not resolved. Went to the ER. Initially, chest x-ray was clear, followed by a CT scan to look for a PE. The patient did not have any PE, but showed bilateral pulmonary infiltrates. The patient had fever of 104 at home for which she took ibuprofen, just 1 episode. She did not have any PND, orthopnea, no edema. No nausea, vomiting or abdominal pain.   PAST MEDICAL HISTORY:  1. Tobacco abuse.  2. Congenital renal atrophy.  3. Bipolar disorder. 4. Anxiety. 5. Endometriosis.  6. Total hysterectomy.  7. Wrist surgery.   ALLERGIES: CIPROFLOXACIN, ZITHROMAX AND TETRACYCLINE, WHICH ALL CAUSE A SIGNIFICANT RASH. NO ANAPHYLAXIS. ALSO HAS ALLERGY TO CODEINE, AMPICILLIN AND FLAGYL.   SOCIAL HISTORY: The patient smokes 2 to 3 cigarettes a day. No alcohol. No illicit drug use. Lives alone.   CODE STATUS: Full Code.   FAMILY HISTORY: Hypertension in her parents.   REVIEW OF SYSTEMS:  CONSTITUTIONAL: Complains of fatigue. No weight loss or weight gain.  EYES: No blurred vision, pain or redness.  ENT: No tinnitus, ear pain, or hearing loss.  CARDIOVASCULAR: No chest pain, orthopnea, edema.  RESPIRATORY: Has cough, shortness of breath.  GASTROINTESTINAL: No nausea, vomiting, diarrhea, abdominal pain.  GENITOURINARY: No dysuria, hematuria or frequency. ENDOCRINE: No polyuria, nocturia, thyroid problems. HEMATOLOGIC AND LYMPHATIC: No anemia, easy bruising or bleeding.  INTEGUMENTARY: No acne, rash or lesion.  MUSCULOSKELETAL: No back pain  or arthritis.  NEUROLOGIC: No focal numbness, weakness or seizure.  PSYCHIATRIC: No anxiety or depression.   HOME MEDICATIONS:  1. Albuterol 2 puffs inhaled every 4 hours.  2. Ambien 5 mg oral once a day. 3. Clonazepam 1.5 mg oral 3 times a day.   PHYSICAL EXAMINATION:  VITAL SIGNS: Temperature 98, pulse initially of 115 and presently 95, respirations 20, blood pressure 124/80, saturating 98% on room air.  GENERAL: Morbidly obese Caucasian female patient sitting up in bed in mild respiratory conversational distress with continuous coughing.  PSYCHIATRIC: Alert and oriented x 3, anxious.  HEENT: Atraumatic, normocephalic. Oral mucosa dry and pink. External ears and nose normal. No pallor, no icterus. Pupils bilaterally equal and reactive to light.  NECK: Supple. No thyromegaly, JVD or carotid bruit.  CARDIOVASCULAR: S1, S2, without any murmurs. Peripheral pulses 2+, no edema.   RESPIRATORY: Has increased work of breathing. Clear to auscultation on both sides.  GASTROINTESTINAL: Soft abdomen, nontender. Bowel sounds present. No hepatomegaly palpable.  SKIN: Warm and dry. No petechiae, rash or ulcers.  MUSCULOSKELETAL: No joint swelling, redness, effusion of large joints. Normal muscle tone.  NEUROLOGIC:Motor  5/5 in upper extremities.  LYMPHATICS: No cervical lymphadenopathy.   LABORATORY STUDIES: Show glucose of 101, BUN 6, creatinine 1.12, sodium 133, potassium 3.2, chloride 98. AST, ALT, alkaline phosphatase, bilirubin normal. CK of 194 with troponin less than 0.02.   WBC 21.1, hemoglobin of 14.6, platelets of 266,000.   Urinalysis shows 2+ bacteria and 4 WBCs. Lactic acid 1.3.   Chest x-ray clear.   CT scan of the chest with contrast showed bilateral pulmonary infiltrates.  ASSESSMENT AND PLAN: 1. Bilateral pneumonia with sepsis in a patient who continues to smoke. The patient has used Bactrim without any relief. At this point, considering her allergies, we will put her on  ceftriaxone. Unfortunately, cannot give her any antibiotics for atypical coverage as she complains of significant rash to tetracycline, ciprofloxacin and Levaquin. We will also put her on p.r.n. DuoNebs, give IV fluid normal saline bolus once and continue IV fluids. The patient will be started on Tussionex for her cough.  2. Tobacco abuse. The patient has been counseled to quit smoking for greater than 3 minutes. She says that she does not smoke much and will quit with this admission.  3. Hypokalemia. Replace orally.  4. Deep vein thrombosis prophylaxis with Lovenox.  5. CODE STATUS: Full Code.  TIME SPENT TODAY ON THIS CASE: 45 minutes.    ____________________________ Mary BailiffSrikar R. Terence Bart, MD srs:JT D: 10/08/2014 11:05:02 ET T: 10/08/2014 11:38:22 ET JOB#: [id number]  cc: Wardell HeathSrikar R. Lyvonne Cassell, MD, <Dictator> Mary FishermanSRIKAR R Mathias Bogacki MD ELECTRONICALLY SIGNED 10/18/2014 10:47

## 2015-11-20 IMAGING — CT CT ABD-PELV W/ CM
2 of 5 series · 17 of 46 positions shown, 19 images · IV contrast (agent unspecified)
Comparison: 06/10/14

CLINICAL DATA: Recent left wrist surgery, on antibiotics, with
nausea and bloody diarrhea today, diarrhea for 5 days

EXAM:
CT ABDOMEN AND PELVIS WITH CONTRAST
TECHNIQUE: Multidetector CT imaging of the abdomen and pelvis was performed
using the standard protocol following bolus administration of
intravenous contrast.
CONTRAST:  100 mL Esovue-ZAA

[Series 2: routine abd pel with · axial · 0.90mm/px · z∈[-448,-58]mm · 14 of 88 slices shown, 16 images]
[im 5/88  soft-tissue]
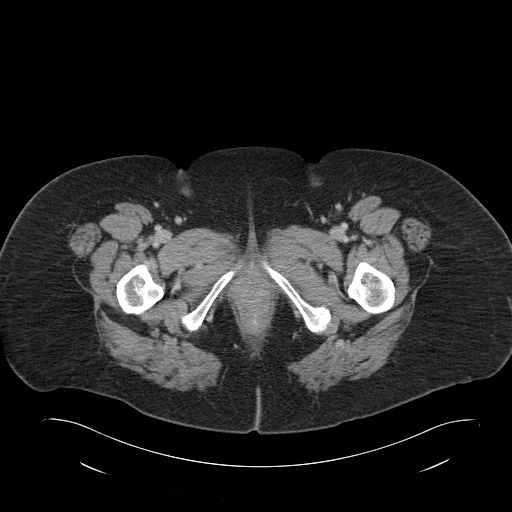
[im 5/88  bone]
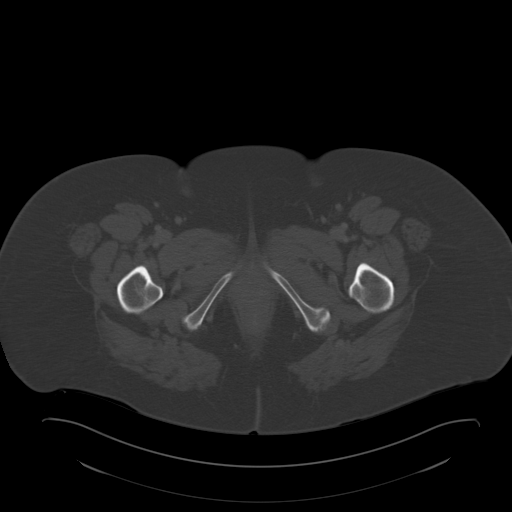
[im 14/88  soft-tissue]
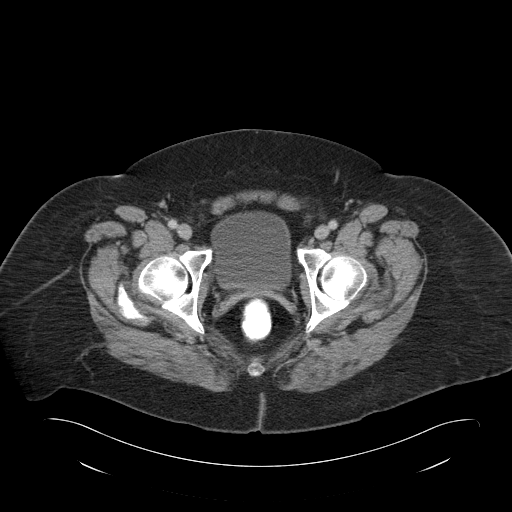
[im 18/88  soft-tissue]
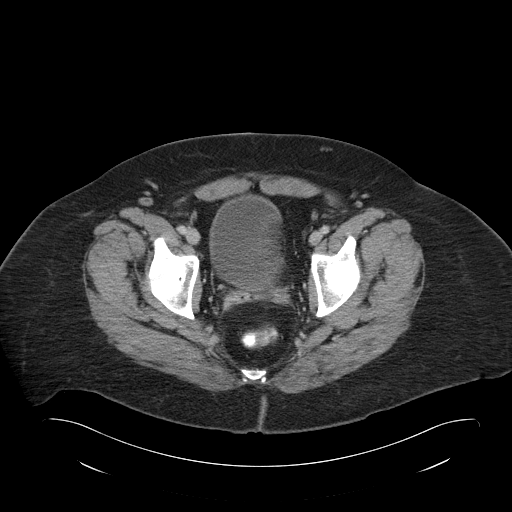
[im 22/88  soft-tissue]
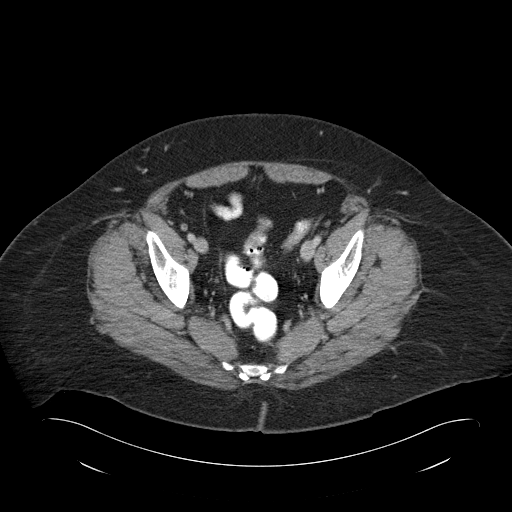
[im 31/88  soft-tissue]
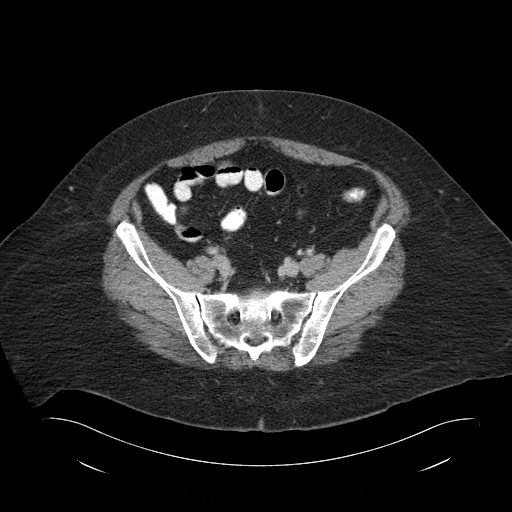
[im 35/88  soft-tissue]
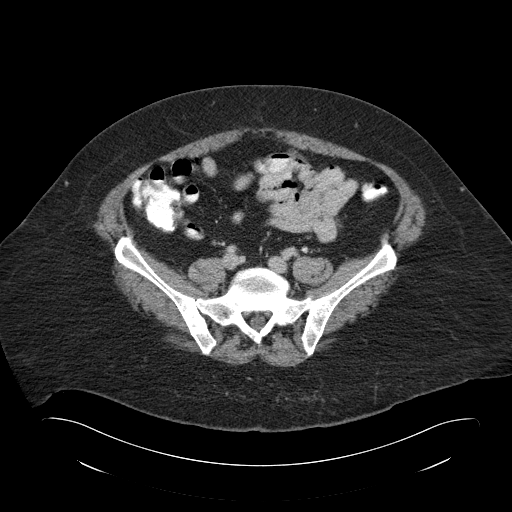
[im 40/88  soft-tissue]
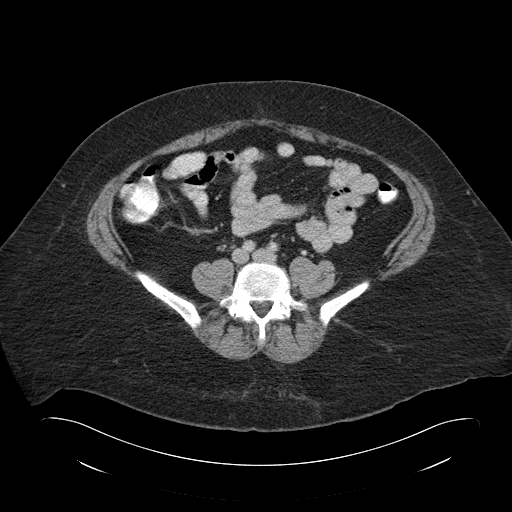
[im 48/88  soft-tissue]
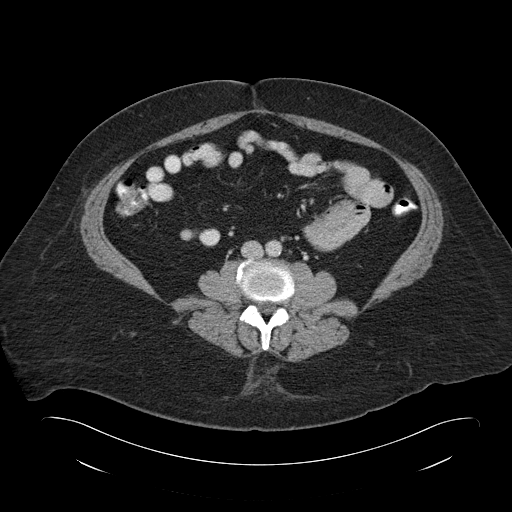
[im 53/88  soft-tissue]
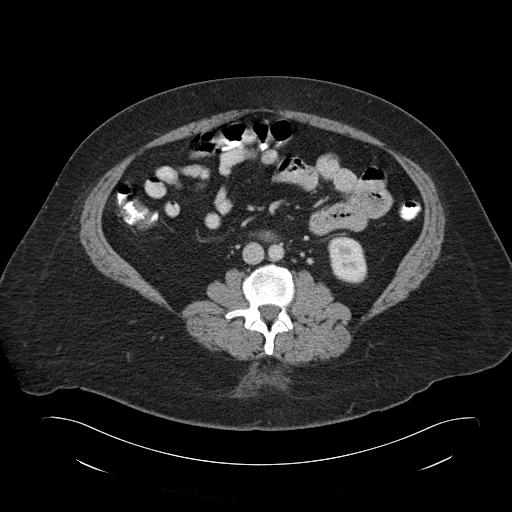
[im 53/88  bone]
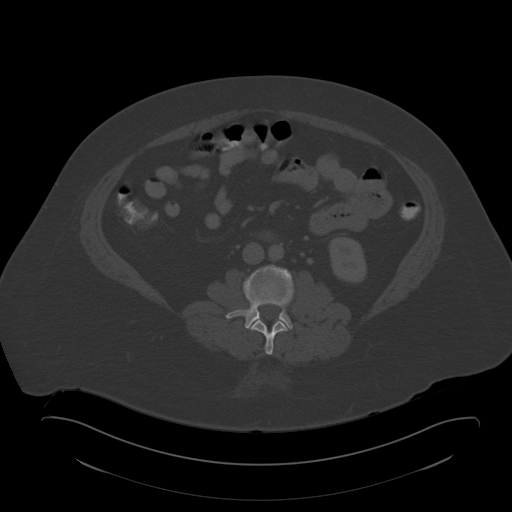
[im 57/88  soft-tissue]
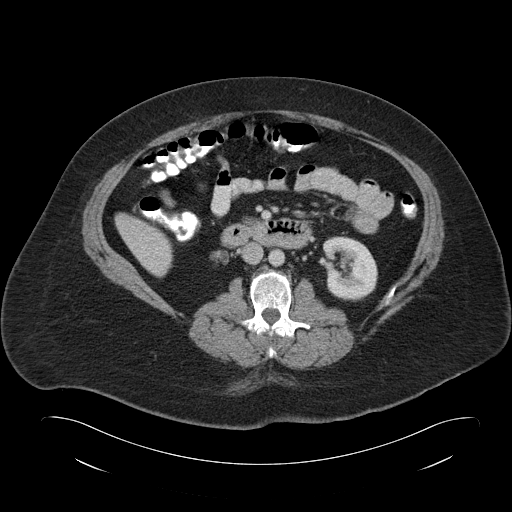
[im 66/88  soft-tissue]
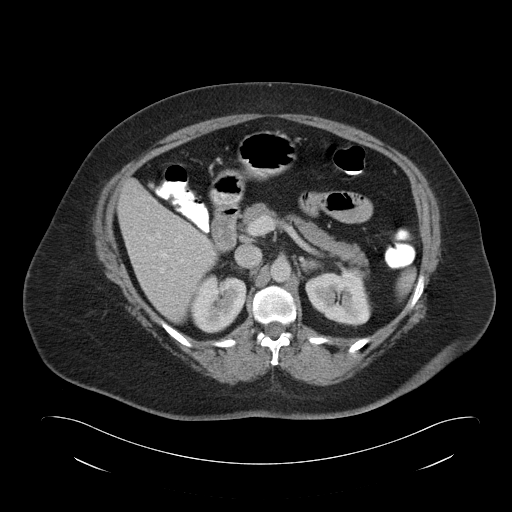
[im 70/88  soft-tissue]
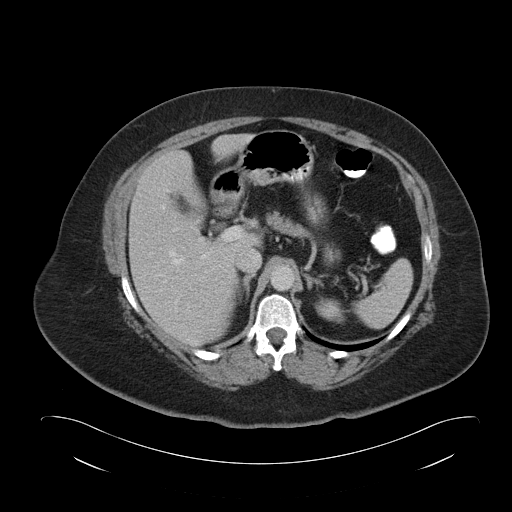
[im 74/88  soft-tissue]
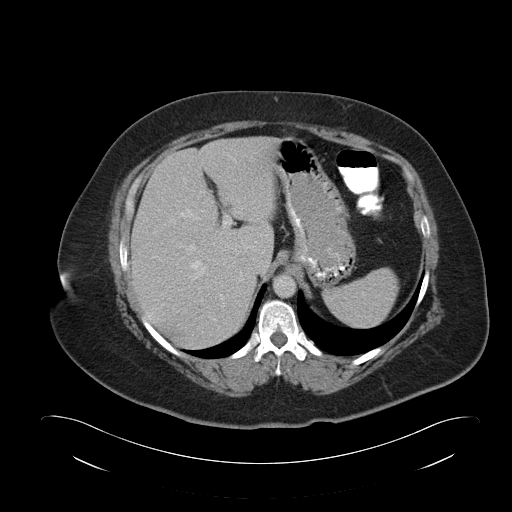
[im 83/88  soft-tissue]
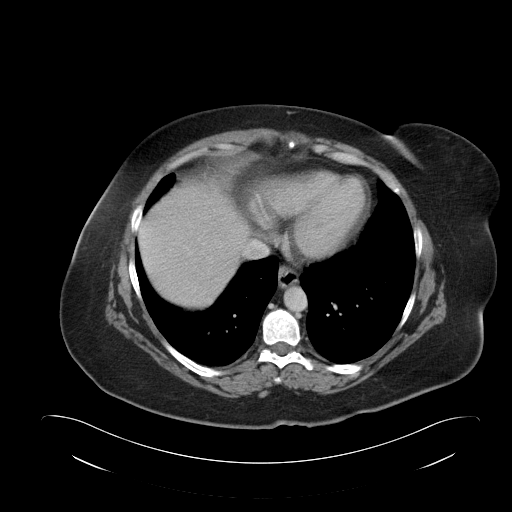

[Series 5: cor routine abd pel with · coronal · 0.80mm/px · 3 of 154 slices shown]
[im 52/154  soft-tissue]
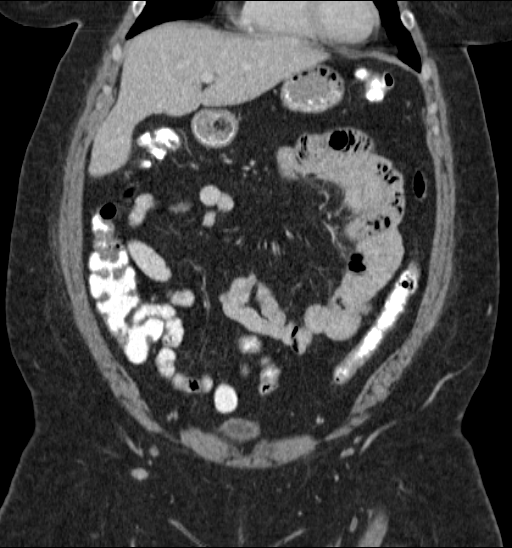
[im 69/154  soft-tissue]
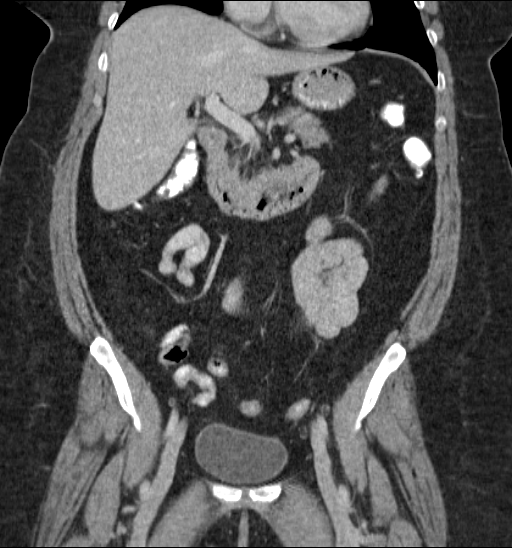
[im 86/154  soft-tissue]
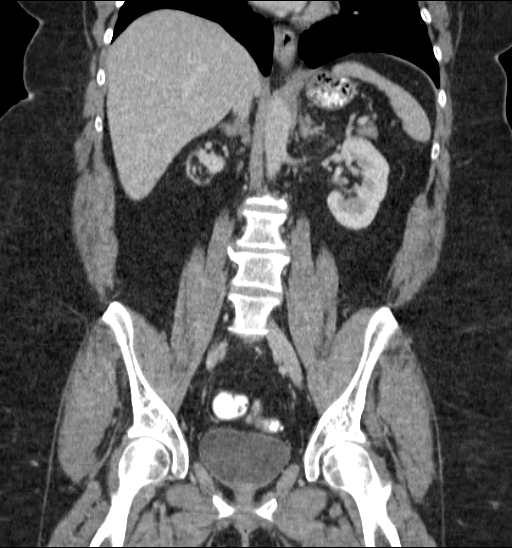

[17 of 46 positions shown; findings below may reference images not displayed]

FINDINGS: Visualized portions of the lung bases are clear.

Liver is normal. Gallbladder appears to be absent. Pancreas is
normal. Adrenal glands are normal. 27 x 15 x 23 mm fat containing
mass -like area lower pole right kidney stable. Mild malrotation
right kidney. Minimal prominence right renal pelvis, less prominent
than it was previously. Left kidney normal.

No significant adenopathy in the abdomen or pelvis. Aorta is not
dilated and shows only minimal calcification. There is a
nonobstructive bowel gas pattern. Small bowel and stomach are
normal. Oral contrast is seen into the distal large bowel. Colon is
normal.

Bladder is normal.  Reproductive organs appear to be absent.

There are no acute musculoskeletal findings. The visualized portions
of the lung bases are clear.
IMPRESSION: No acute findings. Nonobstructive bowel gas pattern with no focal
abnormalities involving bowel.
